# Patient Record
Sex: Female | Born: 1960 | Race: Black or African American | Hispanic: No | Smoking: Current every day smoker
Health system: Southern US, Community
[De-identification: ages and names within clinical notes are randomized; demographics above are authoritative.]

## PROBLEM LIST (undated history)

## (undated) DIAGNOSIS — I509 Heart failure, unspecified: Secondary | ICD-10-CM

## (undated) DIAGNOSIS — I10 Essential (primary) hypertension: Secondary | ICD-10-CM

## (undated) DIAGNOSIS — M1990 Unspecified osteoarthritis, unspecified site: Secondary | ICD-10-CM

## (undated) DIAGNOSIS — E079 Disorder of thyroid, unspecified: Secondary | ICD-10-CM

## (undated) HISTORY — PX: ABDOMINAL HYSTERECTOMY: SHX81

## (undated) HISTORY — DX: Essential (primary) hypertension: I10

## (undated) HISTORY — DX: Heart failure, unspecified: I50.9

---

## 2011-09-29 ENCOUNTER — Ambulatory Visit: Payer: Self-pay

## 2011-10-13 ENCOUNTER — Ambulatory Visit: Payer: Self-pay

## 2011-10-21 ENCOUNTER — Emergency Department: Payer: Self-pay | Admitting: Internal Medicine

## 2011-10-24 ENCOUNTER — Encounter: Payer: Self-pay | Admitting: Emergency Medicine

## 2011-11-21 ENCOUNTER — Encounter: Payer: Self-pay | Admitting: Emergency Medicine

## 2015-07-22 DIAGNOSIS — R7303 Prediabetes: Secondary | ICD-10-CM | POA: Insufficient documentation

## 2015-07-22 DIAGNOSIS — F17200 Nicotine dependence, unspecified, uncomplicated: Secondary | ICD-10-CM | POA: Insufficient documentation

## 2015-09-20 DIAGNOSIS — R030 Elevated blood-pressure reading, without diagnosis of hypertension: Secondary | ICD-10-CM | POA: Insufficient documentation

## 2017-01-16 DIAGNOSIS — J040 Acute laryngitis: Secondary | ICD-10-CM | POA: Insufficient documentation

## 2019-05-27 ENCOUNTER — Emergency Department: Payer: Self-pay

## 2019-05-27 ENCOUNTER — Encounter: Payer: Self-pay | Admitting: Emergency Medicine

## 2019-05-27 ENCOUNTER — Other Ambulatory Visit: Payer: Self-pay

## 2019-05-27 DIAGNOSIS — F17210 Nicotine dependence, cigarettes, uncomplicated: Secondary | ICD-10-CM | POA: Insufficient documentation

## 2019-05-27 DIAGNOSIS — R0789 Other chest pain: Secondary | ICD-10-CM | POA: Insufficient documentation

## 2019-05-27 LAB — CBC
HCT: 41.5 % (ref 36.0–46.0)
Hemoglobin: 13.5 g/dL (ref 12.0–15.0)
MCH: 26.8 pg (ref 26.0–34.0)
MCHC: 32.5 g/dL (ref 30.0–36.0)
MCV: 82.3 fL (ref 80.0–100.0)
Platelets: 186 10*3/uL (ref 150–400)
RBC: 5.04 MIL/uL (ref 3.87–5.11)
RDW: 14.2 % (ref 11.5–15.5)
WBC: 13.8 10*3/uL — ABNORMAL HIGH (ref 4.0–10.5)
nRBC: 0 % (ref 0.0–0.2)

## 2019-05-27 LAB — BASIC METABOLIC PANEL
Anion gap: 12 (ref 5–15)
BUN: 13 mg/dL (ref 6–20)
CO2: 25 mmol/L (ref 22–32)
Calcium: 9.8 mg/dL (ref 8.9–10.3)
Chloride: 95 mmol/L — ABNORMAL LOW (ref 98–111)
Creatinine, Ser: 0.81 mg/dL (ref 0.44–1.00)
GFR calc Af Amer: >60 mL/min (ref >60)
GFR calc non Af Amer: >60 mL/min (ref >60)
Glucose, Bld: 137 mg/dL — ABNORMAL HIGH (ref 70–99)
Potassium: 3.7 mmol/L (ref 3.5–5.1)
Sodium: 132 mmol/L — ABNORMAL LOW (ref 135–145)

## 2019-05-27 LAB — TROPONIN I: Troponin I: <0.03 ng/mL (ref <0.03)

## 2019-05-27 NOTE — ED Triage Notes (Signed)
Pt says she was "backed up " for a few days" because she hasn't taken any medication to help her go; began having pain in her right breast that radiates around to her right back; pt says she took [place] and had a large bowel movement but the pain in her right breast is still present; "I know it's gas"; pt ambulatory with steady gait; talking in complete coherent sentences

## 2019-05-28 ENCOUNTER — Emergency Department
Admission: EM | Admit: 2019-05-28 | Discharge: 2019-05-28 | Disposition: A | Discharge status: Home / Self Care | Payer: Self-pay | Attending: Emergency Medicine | Admitting: Emergency Medicine

## 2019-05-28 DIAGNOSIS — R0789 Other chest pain: Secondary | ICD-10-CM

## 2019-05-28 HISTORY — DX: Disorder of thyroid, unspecified: E07.9

## 2019-05-28 MED ORDER — DICYCLOMINE HCL 20 MG PO TABS: 20.0000 mg | ORAL_TABLET | Freq: Three times a day (TID) | ORAL | 0 refills | Status: DC | PRN

## 2019-05-28 NOTE — Discharge Instructions (Signed)
Please seek medical attention for any high fevers, chest pain, shortness of breath, change in behavior, persistent vomiting, bloody stool or any other new or concerning symptoms.  

## 2019-05-28 NOTE — ED Provider Notes (Signed)
[facility] Emergency Department Provider Note   ____________________________________________   I have reviewed the triage vital signs and the nursing notes.   HISTORY  Chief Complaint Chest pain  History limited by: Not Limited   HPI Katrina Becker is a 58 y.o. female who presents to the emergency department today because of concern for chest pain. She states that it is located around her right breast. The patient states that the pain started a couple of days ago. This was accompanied by constipation which is a frequent problem for the patient. She did take laxatives which resulted in bowel movements. When she has the bowel movements the pain resolves. Additionally she has tried taking advil which helps ease off the pain. The patient states that her appetite has been good and that she has not had any nausea or vomiting. She denies any fevers. Did not notice any blood in her stool.     Records reviewed. Per medical record review patient has a history of thyroid disease.   Past Medical History:  Diagnosis Date  . Thyroid disease     There are no active problems to display for this patient.   Past Surgical History:  Procedure Laterality Date  . ABDOMINAL HYSTERECTOMY    . CESAREAN SECTION      Prior to Admission medications   Not on File    Allergies Patient has no known allergies.  History reviewed. No pertinent family history.  Social History Social History   Tobacco Use  . Smoking status: Current Every Day Smoker    Packs/day: 1.00    Types: Cigarettes  . Smokeless tobacco: Never Used  Substance Use Topics  . Alcohol use: Yes  . Drug use: Never    Review of Systems Constitutional: No fever/chills Eyes: No visual changes. ENT: No sore throat. Cardiovascular: Positive for right chest pain.  Respiratory: Denies shortness of breath. Gastrointestinal: Positive for constipation.  Genitourinary: Negative for  dysuria. Musculoskeletal: Negative for back pain. Skin: Negative for rash. Neurological: Negative for headaches, focal weakness or numbness.  ____________________________________________   PHYSICAL EXAM:  VITAL SIGNS: ED Triage Vitals  Enc Vitals Group     BP 05/27/19 2058 (!) 166/86     Pulse Rate 05/27/19 2058 96     Resp 05/27/19 2058 17     Temp 05/27/19 2058 98.2 F (36.8 C)     Temp Source 05/27/19 2058 Oral     SpO2 05/27/19 2058 95 %     Weight 05/27/19 2100 176 lb (79.8 kg)     Height 05/27/19 2100 5\' 5"  (1.651 m)     Head Circumference --      Peak Flow --      Pain Score 05/27/19 2059 4   Constitutional: Alert and oriented.  Eyes: Conjunctivae are normal.  ENT      Head: Normocephalic and atraumatic.      Nose: No congestion/rhinnorhea.      Mouth/Throat: Mucous membranes are moist.      Neck: No stridor. Hematological/Lymphatic/Immunilogical: No cervical lymphadenopathy. Cardiovascular: Normal rate, regular rhythm.  No murmurs, rubs, or gallops.  Respiratory: Normal respiratory effort without tachypnea nor retractions. Breath sounds are clear and equal bilaterally. No wheezes/rales/rhonchi. Gastrointestinal: Soft and non tender. No rebound. No guarding.  Genitourinary: Deferred Musculoskeletal: Normal range of motion in all extremities. No lower extremity edema. Neurologic:  Normal speech and language. No gross focal neurologic deficits are appreciated.  Skin:  Skin is warm, dry and intact. No rash  noted. Psychiatric: Mood and affect are normal. Speech and behavior are normal. Patient exhibits appropriate insight and judgment.  ____________________________________________    LABS (pertinent positives/negatives)  Trop <0.03 CBC wbc 13.8, hgb 13.5, plt 186 BMP na 132, k 3.7, cl 95, glu 137, cr 0.81  ____________________________________________   EKG  I, Nance Pear, attending physician, personally viewed and interpreted this EKG  EKG Time:  2102 Rate: 89 Rhythm: normal sinus rhythm Axis: normal Intervals: qtc 447 QRS: narrow, LVH ST changes: no st elevation Impression: abnormal ekg  ____________________________________________    RADIOLOGY  CXR Cardiomegaly without acute abnormality  ____________________________________________   PROCEDURES  Procedures  ____________________________________________   INITIAL IMPRESSION / ASSESSMENT AND PLAN / ED COURSE  Pertinent labs & imaging results that were available during my care of the patient were reviewed by me and considered in my medical decision making (see chart for details).   Patient presented because of concern for right chest pain. It is relieved with bowel movements and advil. Patient's exam without any abnormal lung sounds or abdominal tenderness. CXR did not show any pna or ptx. At this point I doubt PE given clinical history. Mild leukocytosis on blood work. Do however doubt gallbladder disease given benign exam and no relation of pain to eating. At this point will give patient prescription for bentyl in case pain is related to abdominal cramping. Discussed return precautions with the patient.   ____________________________________________   FINAL CLINICAL IMPRESSION(S) / ED DIAGNOSES  Final diagnoses:  Atypical chest pain     Note: This dictation was prepared with Dragon dictation. Any transcriptional errors that result from this process are unintentional     Nance Pear, MD 05/28/19 [phone]

## 2019-06-21 DIAGNOSIS — M1611 Unilateral primary osteoarthritis, right hip: Secondary | ICD-10-CM | POA: Insufficient documentation

## 2019-06-21 DIAGNOSIS — M6281 Muscle weakness (generalized): Secondary | ICD-10-CM | POA: Insufficient documentation

## 2019-07-06 ENCOUNTER — Other Ambulatory Visit: Payer: Self-pay | Admitting: *Deleted

## 2019-07-06 DIAGNOSIS — Z20822 Contact with and (suspected) exposure to covid-19: Secondary | ICD-10-CM

## 2019-07-11 LAB — NOVEL CORONAVIRUS, NAA: SARS-CoV-2, NAA: NOT DETECTED

## 2019-11-29 ENCOUNTER — Ambulatory Visit: Payer: Self-pay

## 2019-12-01 ENCOUNTER — Ambulatory Visit: Payer: Self-pay

## 2019-12-28 ENCOUNTER — Ambulatory Visit: Payer: Self-pay

## 2020-06-21 ENCOUNTER — Telehealth: Payer: Self-pay

## 2020-06-21 ENCOUNTER — Other Ambulatory Visit: Payer: Self-pay

## 2020-06-21 NOTE — Telephone Encounter (Signed)
Call to client to reschedule PPD appt for today as agency closed on 06/24/2020. Per recorded message, voicemail box is full. Left message at work number for client to call agency to reschedule appt. Jossie Ng, RN

## 2020-06-25 ENCOUNTER — Other Ambulatory Visit: Payer: Self-pay

## 2020-06-28 ENCOUNTER — Other Ambulatory Visit: Payer: Self-pay

## 2020-07-02 ENCOUNTER — Other Ambulatory Visit: Payer: Self-pay

## 2020-07-02 ENCOUNTER — Ambulatory Visit (LOCAL_COMMUNITY_HEALTH_CENTER): Payer: Self-pay

## 2020-07-02 DIAGNOSIS — Z111 Encounter for screening for respiratory tuberculosis: Secondary | ICD-10-CM

## 2020-07-05 ENCOUNTER — Ambulatory Visit (LOCAL_COMMUNITY_HEALTH_CENTER): Payer: Self-pay

## 2020-07-05 ENCOUNTER — Other Ambulatory Visit: Payer: Self-pay

## 2020-07-05 DIAGNOSIS — Z111 Encounter for screening for respiratory tuberculosis: Secondary | ICD-10-CM

## 2020-07-05 LAB — TB SKIN TEST
Induration: 0 mm
TB Skin Test: NEGATIVE

## 2021-07-29 ENCOUNTER — Inpatient Hospital Stay
Admission: EM | Admit: 2021-07-29 | Discharge: 2021-08-01 | DRG: 291 | Disposition: A | Discharge status: Home / Self Care | Payer: Self-pay | Attending: Internal Medicine | Admitting: Internal Medicine

## 2021-07-29 ENCOUNTER — Encounter: Payer: Self-pay | Admitting: *Deleted

## 2021-07-29 ENCOUNTER — Other Ambulatory Visit: Payer: Self-pay

## 2021-07-29 ENCOUNTER — Emergency Department: Payer: Self-pay

## 2021-07-29 DIAGNOSIS — Z79899 Other long term (current) drug therapy: Secondary | ICD-10-CM

## 2021-07-29 DIAGNOSIS — Z20822 Contact with and (suspected) exposure to covid-19: Secondary | ICD-10-CM | POA: Diagnosis present

## 2021-07-29 DIAGNOSIS — R0602 Shortness of breath: Secondary | ICD-10-CM

## 2021-07-29 DIAGNOSIS — J9601 Acute respiratory failure with hypoxia: Secondary | ICD-10-CM | POA: Diagnosis present

## 2021-07-29 DIAGNOSIS — R000 Tachycardia, unspecified: Secondary | ICD-10-CM

## 2021-07-29 DIAGNOSIS — I5043 Acute on chronic combined systolic (congestive) and diastolic (congestive) heart failure: Secondary | ICD-10-CM | POA: Diagnosis present

## 2021-07-29 DIAGNOSIS — F17210 Nicotine dependence, cigarettes, uncomplicated: Secondary | ICD-10-CM | POA: Diagnosis present

## 2021-07-29 DIAGNOSIS — F17200 Nicotine dependence, unspecified, uncomplicated: Secondary | ICD-10-CM

## 2021-07-29 DIAGNOSIS — I272 Pulmonary hypertension, unspecified: Secondary | ICD-10-CM | POA: Diagnosis present

## 2021-07-29 DIAGNOSIS — I509 Heart failure, unspecified: Secondary | ICD-10-CM

## 2021-07-29 DIAGNOSIS — I10 Essential (primary) hypertension: Secondary | ICD-10-CM

## 2021-07-29 DIAGNOSIS — F1410 Cocaine abuse, uncomplicated: Secondary | ICD-10-CM | POA: Diagnosis present

## 2021-07-29 DIAGNOSIS — I110 Hypertensive heart disease with heart failure: Principal | ICD-10-CM | POA: Diagnosis present

## 2021-07-29 DIAGNOSIS — I161 Hypertensive emergency: Secondary | ICD-10-CM | POA: Diagnosis present

## 2021-07-29 LAB — BASIC METABOLIC PANEL
Anion gap: 10 (ref 5–15)
BUN: 13 mg/dL (ref 6–20)
CO2: 28 mmol/L (ref 22–32)
Calcium: 8.9 mg/dL (ref 8.9–10.3)
Chloride: 102 mmol/L (ref 98–111)
Creatinine, Ser: 0.86 mg/dL (ref 0.44–1.00)
GFR, Estimated: >60 mL/min (ref >60)
Glucose, Bld: 151 mg/dL — ABNORMAL HIGH (ref 70–99)
Potassium: 3.8 mmol/L (ref 3.5–5.1)
Sodium: 140 mmol/L (ref 135–145)

## 2021-07-29 LAB — BRAIN NATRIURETIC PEPTIDE: B Natriuretic Peptide: 446.2 pg/mL — ABNORMAL HIGH (ref 0.0–100.0)

## 2021-07-29 LAB — CBC
HCT: 42.3 % (ref 36.0–46.0)
Hemoglobin: 13.6 g/dL (ref 12.0–15.0)
MCH: 28.1 pg (ref 26.0–34.0)
MCHC: 32.2 g/dL (ref 30.0–36.0)
MCV: 87.4 fL (ref 80.0–100.0)
Platelets: 230 10*3/uL (ref 150–400)
RBC: 4.84 MIL/uL (ref 3.87–5.11)
RDW: 14.6 % (ref 11.5–15.5)
WBC: 10.1 10*3/uL (ref 4.0–10.5)
nRBC: 0 % (ref 0.0–0.2)

## 2021-07-29 LAB — RESP PANEL BY RT-PCR (FLU A&B, COVID) ARPGX2
Influenza A by PCR: NEGATIVE
Influenza B by PCR: NEGATIVE
SARS Coronavirus 2 by RT PCR: NEGATIVE

## 2021-07-29 LAB — D-DIMER, QUANTITATIVE: D-Dimer, Quant: 1.67 ug/mL-FEU — ABNORMAL HIGH (ref 0.00–0.50)

## 2021-07-29 MED ORDER — ONDANSETRON HCL 4 MG/2ML IJ SOLN: 4.0000 mg | Freq: Four times a day (QID) | INTRAMUSCULAR | Status: DC | PRN

## 2021-07-29 MED ORDER — FUROSEMIDE 10 MG/ML IJ SOLN
40.0000 mg | Freq: Two times a day (BID) | INTRAMUSCULAR | Status: DC
  Administered 2021-07-30 – 2021-07-31 (×3): 40 mg via INTRAVENOUS
  Filled 2021-07-29 (×3): qty 4

## 2021-07-29 MED ORDER — MORPHINE SULFATE (PF) 4 MG/ML IV SOLN
6.0000 mg | Freq: Once | INTRAVENOUS | Status: AC
  Administered 2021-07-29: 6 mg via INTRAVENOUS
  Filled 2021-07-29: qty 2

## 2021-07-29 MED ORDER — IOHEXOL 350 MG/ML SOLN
100.0000 mL | Freq: Once | INTRAVENOUS | Status: AC | PRN
  Administered 2021-07-29: 100 mL via INTRAVENOUS

## 2021-07-29 MED ORDER — LOSARTAN POTASSIUM 50 MG PO TABS
50.0000 mg | ORAL_TABLET | Freq: Every day | ORAL | Status: DC
  Administered 2021-07-30 – 2021-08-01 (×4): 50 mg via ORAL
  Filled 2021-07-29 (×4): qty 1

## 2021-07-29 MED ORDER — ONDANSETRON HCL 4 MG PO TABS: 4.0000 mg | ORAL_TABLET | Freq: Four times a day (QID) | ORAL | Status: DC | PRN

## 2021-07-29 MED ORDER — ENOXAPARIN SODIUM 40 MG/0.4ML IJ SOSY
40.0000 mg | PREFILLED_SYRINGE | INTRAMUSCULAR | Status: DC
  Administered 2021-07-30 – 2021-08-01 (×3): 40 mg via SUBCUTANEOUS
  Filled 2021-07-29 (×3): qty 0.4

## 2021-07-29 MED ORDER — SENNOSIDES-DOCUSATE SODIUM 8.6-50 MG PO TABS: 1.0000 | ORAL_TABLET | Freq: Every evening | ORAL | Status: DC | PRN

## 2021-07-29 MED ORDER — ACETAMINOPHEN 325 MG PO TABS
650.0000 mg | ORAL_TABLET | Freq: Four times a day (QID) | ORAL | Status: DC | PRN
  Administered 2021-07-31: 650 mg via ORAL
  Filled 2021-07-29: qty 2

## 2021-07-29 MED ORDER — NICOTINE 14 MG/24HR TD PT24
14.0000 mg | MEDICATED_PATCH | Freq: Every day | TRANSDERMAL | Status: DC
  Administered 2021-07-30 – 2021-08-01 (×3): 14 mg via TRANSDERMAL
  Filled 2021-07-29 (×3): qty 1

## 2021-07-29 MED ORDER — ACETAMINOPHEN 650 MG RE SUPP: 650.0000 mg | Freq: Four times a day (QID) | RECTAL | Status: DC | PRN

## 2021-07-29 MED ORDER — FUROSEMIDE 10 MG/ML IJ SOLN
60.0000 mg | Freq: Once | INTRAMUSCULAR | Status: AC
  Administered 2021-07-29: 60 mg via INTRAVENOUS
  Filled 2021-07-29: qty 8

## 2021-07-29 NOTE — ED Triage Notes (Signed)
Of and on for the past two weeks she has had shortness of breath, worse today. Has been improved with inhaler, but not today. Yellow productive cough. Denies fevers.

## 2021-07-29 NOTE — H&P (Signed)
History and Physical    Katrina Becker GYJ:[id number] DOB: 1961/04/28 DOA: 07/29/2021  [facility]   Patient coming from: home  I have personally briefly reviewed patient's old medical records in [facility]  Chief Complaint: shortness of breath  HPI: Katrina Becker is a 60 y.o. female with medical history significant for   Nicotine dependence and prior documentation of elevated BP as far back as 2017 but without a formal diagnosis of hypertension,who presents with a 2-week history of shortness of breath that has been progressively worsening becoming acutely worse on the night of arrival.  She was initially using a friend's albuterol inhaler with some improvement but over the past couple days it has not helped.  She has an associated cough productive of whitish-yellow phlegm but denies fever or chills.  Denies chest pain.  Denies lower extremity pain or swelling.  Denies nausea, vomiting, abdominal pain or diarrhea.   ED course: On arrival to the ED she was reportedly in  obvious respiratory distress, sitting up and tripoding.   Vitals: Afebrile with BP 153/103, pulse 110, respirations 20 with O2 sat 91 to 96% on room air Blood work: CBC WNL, BMP with slightly elevated blood glucose of 151 but otherwise normal.  BNP 446 and D-dimer 1.67.  No troponin done  EKG, personally viewed and interpreted: Sinus tachycardia at 100 with nonspecific ST-T wave changes  Imaging: Chest x-ray with bilateral perihilar and lower lobe opacities favor edema/CHF CT angio PE negative for acute PE, small bilateral effusions diffuse interstitial pulmonary densities suspected to represent pulmonary edema Dilated main pulmonary vessels suggesting arterial hypertension  Patient was treated with IV Lasix and morphine with improvement in symptoms.  Hospitalist consulted for admission  Review of Systems: As per HPI otherwise all other systems on review of systems negative.    Past Medical History:   Diagnosis Date   Thyroid disease     Past Surgical History:  Procedure Laterality Date   ABDOMINAL HYSTERECTOMY     CESAREAN SECTION       reports that she has been smoking cigarettes. She has been smoking an average of 1 pack per day. She has never used smokeless tobacco. She reports current alcohol use of about 9.0 standard drinks of alcohol per week. She reports current drug use. Drug: Cocaine.  No Known Allergies  History reviewed. No pertinent family history.    Prior to Admission medications   Medication Sig Start Date End Date Taking? Authorizing Provider  dicyclomine (BENTYL) 20 MG tablet Take 1 tablet (20 mg total) by mouth 3 (three) times daily as needed. 05/28/19   Phineas Semen, MD    Physical Exam: Vitals:   07/29/21 2100 07/29/21 2115 07/29/21 2145 07/29/21 2220  BP: (!) 146/108 (!) 158/126 (!) 153/103 (!) 113/95  Pulse: (!) 110 (!) 114 (!) 110 98  Resp: (!) 21 (!) 29 17 20   Temp:      TempSrc:      SpO2: 95% 94% 91% 96%     Vitals:   07/29/21 2100 07/29/21 2115 07/29/21 2145 07/29/21 2220  BP: (!) 146/108 (!) 158/126 (!) 153/103 (!) 113/95  Pulse: (!) 110 (!) 114 (!) 110 98  Resp: (!) 21 (!) 29 17 20   Temp:      TempSrc:      SpO2: 95% 94% 91% 96%      Constitutional: Alert and oriented x 3 .  Conversational dyspnea HEENT:      Head: Normocephalic and atraumatic.  Eyes: PERLA, EOMI, Conjunctivae are normal. Sclera is non-icteric.       Mouth/Throat: Mucous membranes are moist.       Neck: Supple with no signs of meningismus. Cardiovascular: Tachycardia. No murmurs, gallops, or rubs. 2+ symmetrical distal pulses are present . No JVD. No LE edema Respiratory: Respiratory effort increased.Lungs sounds diminished bilaterally. No wheezes, crackles, or rhonchi.  Gastrointestinal: Soft, non tender, and non distended with positive bowel sounds.  Genitourinary: No CVA tenderness. Musculoskeletal: Nontender with normal range of motion in all  extremities. No cyanosis, or erythema of extremities. Neurologic:  Face is symmetric. Moving all extremities. No gross focal neurologic deficits . Skin: Skin is warm, dry.  No rash or ulcers Psychiatric: Mood and affect are normal    Labs on Admission: I have personally reviewed following labs and imaging studies  CBC: Recent Labs  Lab 07/29/21 2023  WBC 10.1  HGB 13.6  HCT 42.3  MCV 87.4  PLT 230   Basic Metabolic Panel: Recent Labs  Lab 07/29/21 2023  NA 140  K 3.8  CL 102  CO2 28  GLUCOSE 151*  BUN 13  CREATININE 0.86  CALCIUM 8.9   GFR: CrCl cannot be calculated (Unknown ideal weight.). Liver Function Tests: No results for input(s): AST, ALT, ALKPHOS, BILITOT, PROT, ALBUMIN in the last 168 hours. No results for input(s): LIPASE, AMYLASE in the last 168 hours. No results for input(s): AMMONIA in the last 168 hours. Coagulation Profile: No results for input(s): INR, PROTIME in the last 168 hours. Cardiac Enzymes: No results for input(s): CKTOTAL, CKMB, CKMBINDEX, TROPONINI in the last 168 hours. BNP (last 3 results) No results for input(s): PROBNP in the last 8760 hours. HbA1C: No results for input(s): HGBA1C in the last 72 hours. CBG: No results for input(s): GLUCAP in the last 168 hours. Lipid Profile: No results for input(s): CHOL, HDL, LDLCALC, TRIG, CHOLHDL, LDLDIRECT in the last 72 hours. Thyroid Function Tests: No results for input(s): TSH, T4TOTAL, FREET4, T3FREE, THYROIDAB in the last 72 hours. Anemia Panel: No results for input(s): VITAMINB12, FOLATE, FERRITIN, TIBC, IRON, RETICCTPCT in the last 72 hours. Urine analysis: No results found for: COLORURINE, APPEARANCEUR, LABSPEC, PHURINE, GLUCOSEU, HGBUR, BILIRUBINUR, KETONESUR, PROTEINUR, UROBILINOGEN, NITRITE, LEUKOCYTESUR  Radiological Exams on Admission: DG Chest 2 View  Result Date: 07/29/2021 CLINICAL DATA:  Shortness of breath EXAM: CHEST - 2 VIEW COMPARISON:  05/27/2019 FINDINGS:  Cardiomegaly. Bilateral perihilar and lower lobe airspace disease, likely edema. No effusions. No acute bony abnormality. IMPRESSION: Bilateral perihilar and lower lobe opacities, favor edema/CHF. Electronically Signed   By: Charlett Nose M.D.   On: 07/29/2021 20:29   CT Angio Chest PE W and/or Wo Contrast  Result Date: 07/29/2021 CLINICAL DATA:  Shortness of breath chest pain EXAM: CT ANGIOGRAPHY CHEST WITH CONTRAST TECHNIQUE: Multidetector CT imaging of the chest was performed using the standard protocol during bolus administration of intravenous contrast. Multiplanar CT image reconstructions and MIPs were obtained to evaluate the vascular anatomy. CONTRAST:  OMNIPAQUE IOHEXOL 350 MG/ML SOLN COMPARISON:  Chest x-ray 07/29/2021 FINDINGS: Cardiovascular: Satisfactory opacification of the pulmonary arteries to the segmental level. No evidence of pulmonary embolism. Mild aortic atherosclerosis. No aneurysmal dilatation. Cardiomegaly. No significant pericardial effusion. Dilated pulmonary trunk and main pulmonary arteries suggesting arterial hypertension. Mediastinum/Nodes: Midline trachea. No thyroid mass. Mild mediastinal adenopathy. Right paratracheal lymph node measures 12 mm. AP window lymph nodes measure up to 14 mm. Esophagus within normal limits Lungs/Pleura: Small bilateral pleural effusions. Diffuse bilateral septal thickening  and mild hazy lung density. Upper Abdomen: No acute abnormality. Musculoskeletal: No chest wall abnormality. No acute or significant osseous findings. Review of the MIP images confirms the above findings. IMPRESSION: 1. Negative for acute pulmonary embolus. 2. Cardiomegaly with small bilateral effusions. Diffuse interstitial and hazy pulmonary densities suspected to represent pulmonary edema 3. Mild nonspecific mediastinal adenopathy 4. Dilated main pulmonary vessels suggesting arterial hypertension Aortic Atherosclerosis (ICD10-I70.0). Electronically Signed   By: Jasmine Pang  M.D.   On: 07/29/2021 22:21     Assessment/Plan 60 year old female with history of nicotine dependence and prior documentation of elevated BP (SBP 150s )as far back as 2017 but without a formal diagnosis of hypertension, presenting with a 2-week history of shortness of breath that has been progressively worsening becoming acutely worse on the night of arrival.      Acute CHF/acute pulmonary edema   Hypertensive emergency - Patient presents with respiratory distress, tachypneic, BP of 173/115, BNP 440s with CTA negative for PE but showing diffuse pulmonary edema.  BNP 440 - Continue IV Lasix twice daily - Hydralazine as needed for blood pressure - Start losartan 50 mg daily - Supplemental oxygen as needed - Daily weights with intake and output monitoring - Echocardiogram in the a.m.    Pulmonary hypertension on CT [facility]) - Follow-up echocardiogram to quantify degree of pulmonary hypertension    Nicotine dependence - Nicotine patch - Tobacco cessation counseling .  Substance abuse - Patient admits to using cocaine intermittently for years with increased use prior to onset of symptoms - Counseling patient on abstinence. daughter at bedside who reinforced message     DVT prophylaxis: Lovenox  Code Status: full code  Family Communication: Daughter at bedside Disposition Plan: Back to previous home environment Consults called: none  Status:At the time of admission, it appears that the appropriate admission status for this patient is INPATIENT. This is judged to be reasonable and necessary in order to provide the required intensity of service to ensure the patient's safety given the presenting symptoms, physical exam findings, and initial radiographic and laboratory data in the context of their  Comorbid conditions.   Patient requires inpatient status due to high intensity of service, high risk for further deterioration and high frequency of surveillance required.   I certify that at the  point of admission it is my clinical judgment that the patient will require inpatient hospital care spanning beyond 2 midnights     Andris Baumann MD [facility]     07/29/2021, 10:49 PM

## 2021-07-29 NOTE — ED Provider Notes (Signed)
[facility] Emergency Department Provider Note ____________________________________________   Event Date/Time   First MD Initiated Contact with Patient 07/29/21 2021     (approximate)  I have reviewed the triage vital signs and the nursing notes.  HISTORY  Chief Complaint No chief complaint on file.   HPI Katrina Becker is a 60 y.o. femalewho presents to the ED for evaluation of shortness of breath.   Chart review indicates cigarette smoker without much relevant history otherwise. Patient reports a 10-pack-year smoking history.  Patient presents to the ED for evaluation of worsening shortness of breath.  She reports symptoms intermittently over the past couple weeks, but today her symptoms drastically worsened.  She reports using a friend's albuterol inhaler over the past week intermittently with some improvement, but no improvement today when she tried it.  She reports cough productive of thin frothy sputum, sometimes yellow in color.  She was report "sometimes" having chest pain with her symptoms.  Denies fevers, syncopal episodes, abdominal pain, emesis  She does report taking milk of magnesia and MiraLAX at home today because she thought it might help with her breathing.  Therefore reports having watery diarrhea today   Past Medical History:  Diagnosis Date   Thyroid disease     There are no problems to display for this patient.   Past Surgical History:  Procedure Laterality Date   ABDOMINAL HYSTERECTOMY     CESAREAN SECTION      Prior to Admission medications   Medication Sig Start Date End Date Taking? Authorizing Provider  dicyclomine (BENTYL) 20 MG tablet Take 1 tablet (20 mg total) by mouth 3 (three) times daily as needed. 05/28/19   Phineas Semen, MD    Allergies Patient has no known allergies.  No family history on file.  Social History Social History   Tobacco Use   Smoking status: Every Day    Packs/day: 1.00     Types: Cigarettes   Smokeless tobacco: Never  Substance Use Topics   Alcohol use: Yes   Drug use: Never    Review of Systems  Constitutional: No fever/chills Eyes: No visual changes. ENT: No sore throat. Cardiovascular: Positive for chest pain. Respiratory: Positive for cough and shortness of breath. Gastrointestinal: No abdominal pain.  No nausea, no vomiting.    No constipation. Positive for diarrhea Genitourinary: Negative for dysuria. Musculoskeletal: Negative for back pain. Skin: Negative for rash. Neurological: Negative for headaches, focal weakness or numbness.  ____________________________________________   PHYSICAL EXAM:  VITAL SIGNS: Vitals:   07/29/21 2145 07/29/21 2220  BP: (!) 153/103 (!) 113/95  Pulse: (!) 110 98  Resp: 17 20  Temp:    SpO2: 91% 96%     Constitutional: Alert and oriented.  Restless in bed.  Prefers to sit upright and tripod Eyes: Conjunctivae are normal. PERRL. EOMI. Head: Atraumatic. Nose: No congestion/rhinnorhea. Mouth/Throat: Mucous membranes are moist.  Oropharynx non-erythematous. Neck: No stridor. No cervical spine tenderness to palpation. Cardiovascular: Tachycardic rate, regular rhythm. Grossly normal heart sounds.  Good peripheral circulation. Respiratory: Tachypneic to nearly 30.  No wheezes noted and bibasilar crackles are present Gastrointestinal: Soft , nondistended, nontender to palpation. No CVA tenderness. Musculoskeletal: No lower extremity tenderness.  No joint effusions. No signs of acute trauma. Trace pitting edema to bilateral lower extremities. Neurologic:  Normal speech and language. No gross focal neurologic deficits are appreciated. No gait instability noted. Skin:  Skin is warm, dry and intact. No rash noted. Psychiatric: Mood and affect are  normal. Speech and behavior are normal.  ____________________________________________   LABS (all labs ordered are listed, but only abnormal results are  displayed)  Labs Reviewed  BASIC METABOLIC PANEL - Abnormal; Notable for the following components:      Result Value   Glucose, Bld 151 (*)    All other components within normal limits  D-DIMER, QUANTITATIVE - Abnormal; Notable for the following components:   D-Dimer, Quant 1.67 (*)    All other components within normal limits  BRAIN NATRIURETIC PEPTIDE - Abnormal; Notable for the following components:   B Natriuretic Peptide 446.2 (*)    All other components within normal limits  RESP PANEL BY RT-PCR (FLU A&B, COVID) ARPGX2  CBC   ____________________________________________  12 Lead EKG  Sinus tachycardia with a rate of 100 bpm.  Rightward axis.  Normal intervals.  No STEMI. ____________________________________________  RADIOLOGY  ED MD interpretation: 2 view CXR reviewed by me with pulmonary vascular congestion  Official radiology report(s): DG Chest 2 View  Result Date: 07/29/2021 CLINICAL DATA:  Shortness of breath EXAM: CHEST - 2 VIEW COMPARISON:  05/27/2019 FINDINGS: Cardiomegaly. Bilateral perihilar and lower lobe airspace disease, likely edema. No effusions. No acute bony abnormality. IMPRESSION: Bilateral perihilar and lower lobe opacities, favor edema/CHF. Electronically Signed   By: Charlett Nose M.D.   On: 07/29/2021 20:29   CT Angio Chest PE W and/or Wo Contrast  Result Date: 07/29/2021 CLINICAL DATA:  Shortness of breath chest pain EXAM: CT ANGIOGRAPHY CHEST WITH CONTRAST TECHNIQUE: Multidetector CT imaging of the chest was performed using the standard protocol during bolus administration of intravenous contrast. Multiplanar CT image reconstructions and MIPs were obtained to evaluate the vascular anatomy. CONTRAST:  OMNIPAQUE IOHEXOL 350 MG/ML SOLN COMPARISON:  Chest x-ray 07/29/2021 FINDINGS: Cardiovascular: Satisfactory opacification of the pulmonary arteries to the segmental level. No evidence of pulmonary embolism. Mild aortic atherosclerosis. No aneurysmal  dilatation. Cardiomegaly. No significant pericardial effusion. Dilated pulmonary trunk and main pulmonary arteries suggesting arterial hypertension. Mediastinum/Nodes: Midline trachea. No thyroid mass. Mild mediastinal adenopathy. Right paratracheal lymph node measures 12 mm. AP window lymph nodes measure up to 14 mm. Esophagus within normal limits Lungs/Pleura: Small bilateral pleural effusions. Diffuse bilateral septal thickening and mild hazy lung density. Upper Abdomen: No acute abnormality. Musculoskeletal: No chest wall abnormality. No acute or significant osseous findings. Review of the MIP images confirms the above findings. IMPRESSION: 1. Negative for acute pulmonary embolus. 2. Cardiomegaly with small bilateral effusions. Diffuse interstitial and hazy pulmonary densities suspected to represent pulmonary edema 3. Mild nonspecific mediastinal adenopathy 4. Dilated main pulmonary vessels suggesting arterial hypertension Aortic Atherosclerosis (ICD10-I70.0). Electronically Signed   By: Jasmine Pang M.D.   On: 07/29/2021 22:21    ____________________________________________   PROCEDURES and INTERVENTIONS  Procedure(s) performed (including Critical Care):  .1-3 Lead EKG Interpretation  Date/Time: 07/29/2021 10:12 PM Performed by: Delton Prairie, MD Authorized by: Delton Prairie, MD     Interpretation: abnormal     ECG rate:  110   ECG rate assessment: tachycardic     Rhythm: sinus tachycardia     Ectopy: none     Conduction: normal    Medications  furosemide (LASIX) injection 60 mg (60 mg Intravenous Given 07/29/21 2114)  iohexol (OMNIPAQUE) 350 MG/ML injection 100 mL (100 mLs Intravenous Contrast Given 07/29/21 2205)  morphine 4 MG/ML injection 6 mg (6 mg Intravenous Given 07/29/21 2144)    ____________________________________________   MDM / ED COURSE   60 year old woman presents to the ED  with worsening shortness of breath and tachycardia, with evidence of new onset CHF requiring medical  admission.  Tachycardic, slightly hypertensive and with sats in the low 90s on room air.  Exam demonstrates very restless and uncomfortable-appearing patient who prefers to sit upright in tripod, with her clinical picture improving after small dose of morphine.  Placed on nasal cannula for comfort.  CXR congested without PTX or discrete lobar infiltration.  Blood work with elevated BNP and D-dimer.  CTA chest rules out acute PE, and further shows evidence of CHF.  Initiated diuresis here in the ED with 60 of IV Lasix.  We will admit to medicine for further work-up and management.  Clinical Course as of 07/29/21 2227  Tue Jul 29, 2021  2122 reassessed [DS]  2139 Reassessed.  Patient had refused to go to CT to get a CTA chest due to her dyspnea.  She reports fear of laying flat due to her respiratory symptoms.  We discussed my concern for the possibility of PE and the need for CTA chest.  We discussed medication to help with her air hunger sensation to facilitate CT.  She is agreeable. [DS]  2154 Patient looks much more comfortable after morphine.  She is agreeable to trying a CT [DS]    Clinical Course User Index [DS] Delton Prairie, MD    ____________________________________________   FINAL CLINICAL IMPRESSION(S) / ED DIAGNOSES  Final diagnoses:  New onset of congestive heart failure (HCC)  Shortness of breath  Sinus tachycardia     ED Discharge Orders     None        Zaara Sprowl   Note:  This document was prepared using Dragon voice recognition software and may include unintentional dictation errors.    Delton Prairie, MD 07/29/21 2227

## 2021-07-30 ENCOUNTER — Inpatient Hospital Stay
Admit: 2021-07-30 | Discharge: 2021-07-30 | Disposition: A | Discharge status: Home / Self Care | Payer: Self-pay | Attending: Internal Medicine | Admitting: Internal Medicine

## 2021-07-30 ENCOUNTER — Encounter: Payer: Self-pay | Admitting: Internal Medicine

## 2021-07-30 LAB — BASIC METABOLIC PANEL
Anion gap: 6 (ref 5–15)
BUN: 10 mg/dL (ref 6–20)
CO2: 26 mmol/L (ref 22–32)
Calcium: 7.1 mg/dL — ABNORMAL LOW (ref 8.9–10.3)
Chloride: 107 mmol/L (ref 98–111)
Creatinine, Ser: 0.57 mg/dL (ref 0.44–1.00)
GFR, Estimated: >60 mL/min (ref >60)
Glucose, Bld: 150 mg/dL — ABNORMAL HIGH (ref 70–99)
Potassium: 3.2 mmol/L — ABNORMAL LOW (ref 3.5–5.1)
Sodium: 139 mmol/L (ref 135–145)

## 2021-07-30 LAB — HIV ANTIBODY (ROUTINE TESTING W REFLEX): HIV Screen 4th Generation wRfx: NONREACTIVE

## 2021-07-30 MED ORDER — DIPHENHYDRAMINE HCL 25 MG PO CAPS
25.0000 mg | ORAL_CAPSULE | Freq: Every evening | ORAL | Status: DC | PRN
  Administered 2021-07-30: 25 mg via ORAL
  Filled 2021-07-30: qty 1

## 2021-07-30 NOTE — Progress Notes (Signed)
PROGRESS NOTE    Katrina Becker  NIO:[id number] DOB: 25-Dec-1960 DOA: 07/29/2021 [facility]    Brief Narrative:  60 y.o. female with medical history significant for   Nicotine dependence and prior documentation of elevated BP as far back as 2017 but without a formal diagnosis of hypertension,who presents with a 2-week history of shortness of breath that has been progressively worsening becoming acutely worse on the night of arrival.  She was initially using a friend's albuterol inhaler with some improvement but over the past couple days it has not helped.  She has an associated cough productive of whitish-yellow phlegm but denies fever or chills.  Denies chest pain.  Denies lower extremity pain or swelling.  Denies nausea, vomiting, abdominal pain or diarrhea.  Admitted under working diagnosis of decompensated congestive heart failure.  Patient has no history of above diagnosis.  Endorses marked improvement in respiratory symptoms since admission and initiation of IV diuresis.   Assessment & Plan:   Principal Problem:   Acute CHF (congestive heart failure) (HCC) Active Problems:   Hypertensive emergency   Nicotine dependence   Essential hypertension   Pulmonary hypertension on CT [facility])  Pulmonary edema associate with shortness of breath Hypertensive emergency Suspected new diagnosis congestive heart failure Acute hypoxic respiratory failure secondary to above - Patient presents with respiratory distress, tachypneic, BP of 173/115, BNP 440s with CTA negative for PE but showing diffuse pulmonary edema.  Strong suspicion for decompensated congestive heart failure Plan: Continue IV diuresis Continues losartan 50 mg daily As needed hydralazine Follow-up TTE Wean oxygen as tolerated Possible discharge in 24 hours of echocardiogram reassuring Continue daily weights and strict I's and O's   Suspected pulmonary hypertension - Follow-up echocardiogram to quantify degree of  pulmonary hypertension     Nicotine dependence - Nicotine patch - Tobacco cessation counseling .  Substance abuse - Patient admits to using cocaine intermittently for years with increased use prior to onset of symptoms - Counseling patient on abstinence. daughter at bedside who reinforced message     DVT prophylaxis: SQ Lovenox Code Status: Full Family Communication: None today Disposition Plan: Status is: Inpatient  Remains inpatient appropriate because:Inpatient level of care appropriate due to severity of illness  Dispo: The patient is from: Home              Anticipated d/c is to: Home              Patient currently is not medically stable to d/c.   Difficult to place patient No       Level of care: Progressive Cardiac  Consultants:  None  Procedures:  None  Antimicrobials:  None   Subjective: Seen and examined.  No pain complaints.  Patient endorses significant improvement since initiation of IV diuretic  Objective: Vitals:   07/30/21 0830 07/30/21 0845 07/30/21 0900 07/30/21 1000  BP: (!) 148/101 125/89 135/81 121/67  Pulse: 88 84 (!) 56 81  Resp: (!) 23 (!) 22 17 (!) 23  Temp:      TempSrc:      SpO2: 100% 96% 95% (!) 84%  Weight:      Height:        Intake/Output Summary (Last 24 hours) at 07/30/2021 1509 Last data filed at 07/30/2021 0851 Gross per 24 hour  Intake --  Output 1475 ml  Net -1475 ml   Filed Weights   07/29/21 2336  Weight: 79.4 kg    Examination:  General exam: Appears calm and comfortable  Respiratory system: Scattered crackles bilaterally.  Normal work of breathing.  2 L Cardiovascular system: S1-S2, regular rate and rhythm, no murmurs, no pedal edema Gastrointestinal system: Nontender, nondistended, normal bowel sounds Central nervous system: Alert and oriented. No focal neurological deficits. Extremities: Symmetric 5 x 5 power. Skin: No rashes, lesions or ulcers Psychiatry: Judgement and insight appear normal. Mood  & affect appropriate.     Data Reviewed: I have personally reviewed following labs and imaging studies  CBC: Recent Labs  Lab 07/29/21 2023  WBC 10.1  HGB 13.6  HCT 42.3  MCV 87.4  PLT 230   Basic Metabolic Panel: Recent Labs  Lab 07/29/21 2023 07/30/21 0456  NA 140 139  K 3.8 3.2*  CL 102 107  CO2 28 26  GLUCOSE 151* 150*  BUN 13 10  CREATININE 0.86 0.57  CALCIUM 8.9 7.1*   GFR: Estimated Creatinine Clearance: 83.8 mL/min (by C-G formula based on SCr of 0.57 mg/dL). Liver Function Tests: No results for input(s): AST, ALT, ALKPHOS, BILITOT, PROT, ALBUMIN in the last 168 hours. No results for input(s): LIPASE, AMYLASE in the last 168 hours. No results for input(s): AMMONIA in the last 168 hours. Coagulation Profile: No results for input(s): INR, PROTIME in the last 168 hours. Cardiac Enzymes: No results for input(s): CKTOTAL, CKMB, CKMBINDEX, TROPONINI in the last 168 hours. BNP (last 3 results) No results for input(s): PROBNP in the last 8760 hours. HbA1C: No results for input(s): HGBA1C in the last 72 hours. CBG: No results for input(s): GLUCAP in the last 168 hours. Lipid Profile: No results for input(s): CHOL, HDL, LDLCALC, TRIG, CHOLHDL, LDLDIRECT in the last 72 hours. Thyroid Function Tests: No results for input(s): TSH, T4TOTAL, FREET4, T3FREE, THYROIDAB in the last 72 hours. Anemia Panel: No results for input(s): VITAMINB12, FOLATE, FERRITIN, TIBC, IRON, RETICCTPCT in the last 72 hours. Sepsis Labs: No results for input(s): PROCALCITON, LATICACIDVEN in the last 168 hours.  Recent Results (from the past 240 hour(s))  Resp Panel by RT-PCR (Flu A&B, Covid) Nasopharyngeal Swab     Status: None   Collection Time: 07/29/21  8:37 PM   Specimen: Nasopharyngeal Swab; Nasopharyngeal(NP) swabs in vial transport medium  Result Value Ref Range Status   SARS Coronavirus 2 by RT PCR NEGATIVE NEGATIVE Final    Comment: (NOTE) SARS-CoV-2 target nucleic acids are  NOT DETECTED.  The SARS-CoV-2 RNA is generally detectable in upper respiratory specimens during the acute phase of infection. The lowest concentration of SARS-CoV-2 viral copies this assay can detect is 138 copies/mL. A negative result does not preclude SARS-Cov-2 infection and should not be used as the sole basis for treatment or other patient management decisions. A negative result may occur with  improper specimen collection/handling, submission of specimen other than nasopharyngeal swab, presence of viral mutation(s) within the areas targeted by this assay, and inadequate number of viral copies(<138 copies/mL). A negative result must be combined with clinical observations, patient history, and epidemiological information. The expected result is Negative.  Fact Sheet for Patients:  BloggerCourse.com  Fact Sheet for Healthcare Providers:  SeriousBroker.it  This test is no t yet approved or cleared by the [place] FDA and  has been authorized for detection and/or diagnosis of SARS-CoV-2 by FDA under an Emergency Use Authorization (EUA). This EUA will remain  in effect (meaning this test can be used) for the duration of the COVID-19 declaration under Section 564(b)(1) of the Act, 21 U.S.C.section 360bbb-3(b)(1), unless the authorization is terminated  or revoked  sooner.       Influenza A by PCR NEGATIVE NEGATIVE Final   Influenza B by PCR NEGATIVE NEGATIVE Final    Comment: (NOTE) The Xpert Xpress SARS-CoV-2/FLU/RSV plus assay is intended as an aid in the diagnosis of influenza from Nasopharyngeal swab specimens and should not be used as a sole basis for treatment. Nasal washings and aspirates are unacceptable for Xpert Xpress SARS-CoV-2/FLU/RSV testing.  Fact Sheet for Patients: BloggerCourse.com  Fact Sheet for Healthcare Providers: SeriousBroker.it  This test is not  yet approved or cleared by the [place] FDA and has been authorized for detection and/or diagnosis of SARS-CoV-2 by FDA under an Emergency Use Authorization (EUA). This EUA will remain in effect (meaning this test can be used) for the duration of the COVID-19 declaration under Section 564(b)(1) of the Act, 21 U.S.C. section 360bbb-3(b)(1), unless the authorization is terminated or revoked.  Performed at [facility], [address] [place], [place] [address]          Radiology Studies: DG Chest 2 View  Result Date: 07/29/2021 CLINICAL DATA:  Shortness of breath EXAM: CHEST - 2 VIEW COMPARISON:  05/27/2019 FINDINGS: Cardiomegaly. Bilateral perihilar and lower lobe airspace disease, likely edema. No effusions. No acute bony abnormality. IMPRESSION: Bilateral perihilar and lower lobe opacities, favor edema/CHF. Electronically Signed   By: Charlett Nose M.D.   On: 07/29/2021 20:29   CT Angio Chest PE W and/or Wo Contrast  Result Date: 07/29/2021 CLINICAL DATA:  Shortness of breath chest pain EXAM: CT ANGIOGRAPHY CHEST WITH CONTRAST TECHNIQUE: Multidetector CT imaging of the chest was performed using the standard protocol during bolus administration of intravenous contrast. Multiplanar CT image reconstructions and MIPs were obtained to evaluate the vascular anatomy. CONTRAST:  OMNIPAQUE IOHEXOL 350 MG/ML SOLN COMPARISON:  Chest x-ray 07/29/2021 FINDINGS: Cardiovascular: Satisfactory opacification of the pulmonary arteries to the segmental level. No evidence of pulmonary embolism. Mild aortic atherosclerosis. No aneurysmal dilatation. Cardiomegaly. No significant pericardial effusion. Dilated pulmonary trunk and main pulmonary arteries suggesting arterial hypertension. Mediastinum/Nodes: Midline trachea. No thyroid mass. Mild mediastinal adenopathy. Right paratracheal lymph node measures 12 mm. AP window lymph nodes measure up to 14 mm. Esophagus within normal limits  Lungs/Pleura: Small bilateral pleural effusions. Diffuse bilateral septal thickening and mild hazy lung density. Upper Abdomen: No acute abnormality. Musculoskeletal: No chest wall abnormality. No acute or significant osseous findings. Review of the MIP images confirms the above findings. IMPRESSION: 1. Negative for acute pulmonary embolus. 2. Cardiomegaly with small bilateral effusions. Diffuse interstitial and hazy pulmonary densities suspected to represent pulmonary edema 3. Mild nonspecific mediastinal adenopathy 4. Dilated main pulmonary vessels suggesting arterial hypertension Aortic Atherosclerosis (ICD10-I70.0). Electronically Signed   By: Jasmine Pang M.D.   On: 07/29/2021 22:21        Scheduled Meds:  enoxaparin (LOVENOX) injection  40 mg Subcutaneous Q24H   furosemide  40 mg Intravenous BID   losartan  50 mg Oral Daily   nicotine  14 mg Transdermal Daily   Continuous Infusions:   LOS: 1 day    Time spent: 25 minutes    Tresa Moore, MD [facility] Pager [phone]  If 7PM-7AM, please contact night-coverage 07/30/2021, 3:09 PM

## 2021-07-30 NOTE — ED Notes (Signed)
Per attending provider, try to wean pt off oxygen. Weaned from 2L to 1L, SPO2 now 99%. Turned off oxygen, SPO2 100%.  Pt denies CP, SOB while semi reclining.

## 2021-07-30 NOTE — ED Notes (Signed)
Dr Sreenath at bedside 

## 2021-07-30 NOTE — Plan of Care (Signed)

## 2021-07-30 NOTE — ED Notes (Signed)
HOB adjusted. Pt has no further needs at this time. Call light within reach.

## 2021-07-30 NOTE — ED Notes (Signed)
Patient with shortness of breath and cough, O2 saturation is 97% room air.  Lasix given per order.

## 2021-07-30 NOTE — Progress Notes (Signed)
  CSW contacted Heart Failure RN, w/ heart failure consult request. 

## 2021-07-31 LAB — BASIC METABOLIC PANEL
Anion gap: 8 (ref 5–15)
BUN: 13 mg/dL (ref 6–20)
CO2: 30 mmol/L (ref 22–32)
Calcium: 8.9 mg/dL (ref 8.9–10.3)
Chloride: 101 mmol/L (ref 98–111)
Creatinine, Ser: 0.81 mg/dL (ref 0.44–1.00)
GFR, Estimated: >60 mL/min (ref >60)
Glucose, Bld: 106 mg/dL — ABNORMAL HIGH (ref 70–99)
Potassium: 3.5 mmol/L (ref 3.5–5.1)
Sodium: 139 mmol/L (ref 135–145)

## 2021-07-31 LAB — CBC WITH DIFFERENTIAL/PLATELET
Abs Immature Granulocytes: 0.05 10*3/uL (ref 0.00–0.07)
Basophils Absolute: 0 10*3/uL (ref 0.0–0.1)
Basophils Relative: 0 %
Eosinophils Absolute: 0.1 10*3/uL (ref 0.0–0.5)
Eosinophils Relative: 1 %
HCT: 41.3 % (ref 36.0–46.0)
Hemoglobin: 13.3 g/dL (ref 12.0–15.0)
Immature Granulocytes: 1 %
Lymphocytes Relative: 22 %
Lymphs Abs: 2.1 10*3/uL (ref 0.7–4.0)
MCH: 28 pg (ref 26.0–34.0)
MCHC: 32.2 g/dL (ref 30.0–36.0)
MCV: 86.9 fL (ref 80.0–100.0)
Monocytes Absolute: 0.8 10*3/uL (ref 0.1–1.0)
Monocytes Relative: 9 %
Neutro Abs: 6.7 10*3/uL (ref 1.7–7.7)
Neutrophils Relative %: 67 %
Platelets: 240 10*3/uL (ref 150–400)
RBC: 4.75 MIL/uL (ref 3.87–5.11)
RDW: 14.3 % (ref 11.5–15.5)
WBC: 9.8 10*3/uL (ref 4.0–10.5)
nRBC: 0 % (ref 0.0–0.2)

## 2021-07-31 MED ORDER — GUAIFENESIN-DM 100-10 MG/5ML PO SYRP: 5.0000 mL | ORAL_SOLUTION | ORAL | Status: DC | PRN

## 2021-07-31 MED ORDER — FUROSEMIDE 10 MG/ML IJ SOLN
40.0000 mg | Freq: Every day | INTRAMUSCULAR | Status: DC
  Administered 2021-08-01: 40 mg via INTRAVENOUS
  Filled 2021-07-31: qty 4

## 2021-07-31 MED ORDER — BENZONATATE 100 MG PO CAPS
200.0000 mg | ORAL_CAPSULE | Freq: Three times a day (TID) | ORAL | Status: DC
  Administered 2021-07-31 – 2021-08-01 (×4): 200 mg via ORAL
  Filled 2021-07-31 (×4): qty 2

## 2021-07-31 MED ORDER — LORAZEPAM 2 MG/ML IJ SOLN
0.5000 mg | INTRAMUSCULAR | Status: DC | PRN
  Administered 2021-07-31: 0.5 mg via INTRAVENOUS
  Filled 2021-07-31 (×2): qty 1

## 2021-07-31 NOTE — Consult Note (Signed)
   Heart Failure Nurse Navigator Note  HF--echocardiogram results are pending at this time.  She presented to the emergency room with worsening shortness of breath, productive cough.  Comorbidities:  Continued tobacco abuse   Medications:  Furosemide 40 mg IV 2 times a day Losartan 50 mg daily   Labs:  Sodium 139, potassium 3.5, chloride 101, CO2 30, BUN 13, creatinine 0.81 Intake 120 mL Output 1775 Weight not documented since admission to the ED.  Assessment:  Katrina Becker is awake and alert sitting up at the bedside, daughter his braiding her hair.  He appears in no acute distress.  HEENT-normocephalic, no JVD noted.  Cardiac-heart tones of regular rate and rhythm.  Musculoskeletal-there is no lower extremity edema noted.  Psych-is pleasant and appropriate.  Neurologic-speech is clear moves extremities without any difficulty.    Initial meeting with patient, daughter and granddaughter.  Discussed the importance of eating low-sodium diet and sticking with the fluid restriction.  Also discussed weighing daily and what to report to physician.  Discussed fluids that she drinks throughout the day.  States that she does not drink alcohol on a daily basis but will have beers on the weekend.  Also had smoked approximately a pack of cigarettes a day but states that she has now quit.  Congratulated on that.  She was given the living with heart failure teaching booklet, zone magnet and information on low-sodium.  She is also given the low-sodium cookbook.   Tresa Endo RN [facility]

## 2021-07-31 NOTE — Progress Notes (Signed)
PROGRESS NOTE    Katrina Becker  XTK:[id number] DOB: 06-12-1961 DOA: 07/29/2021 [facility]    Brief Narrative:  60 y.o. female with medical history significant for   Nicotine dependence and prior documentation of elevated BP as far back as 2017 but without a formal diagnosis of hypertension,who presents with a 2-week history of shortness of breath that has been progressively worsening becoming acutely worse on the night of arrival.  She was initially using a friend's albuterol inhaler with some improvement but over the past couple days it has not helped.  She has an associated cough productive of whitish-yellow phlegm but denies fever or chills.  Denies chest pain.  Denies lower extremity pain or swelling.  Denies nausea, vomiting, abdominal pain or diarrhea.  Admitted under working diagnosis of decompensated congestive heart failure.  Patient has no history of above diagnosis.  Endorses marked improvement in respiratory symptoms since admission and initiation of IV diuresis.   Assessment & Plan:   Principal Problem:   Acute CHF (congestive heart failure) (HCC) Active Problems:   Hypertensive emergency   Nicotine dependence   Essential hypertension   Pulmonary hypertension on CT [facility])  Pulmonary edema associate with shortness of breath Hypertensive emergency Suspected new diagnosis congestive heart failure Acute hypoxic respiratory failure secondary to above - Patient presents with respiratory distress, tachypneic, BP of 173/115, BNP 440s with CTA negative for PE but showing diffuse pulmonary edema.  Strong suspicion for decompensated congestive heart failure Plan: Continue IV diuresis Target net -1-1.5 L daily Continues losartan 50 mg daily As needed hydralazine Follow-up TTE, completed and results pending Wean oxygen as tolerated Tentative plan discharge home 8/12 Continue daily weights and strict I's and O's Anticipate outpatient referral to cardiology    Suspected pulmonary hypertension - Follow-up echocardiogram to quantify degree of pulmonary hypertension     Nicotine dependence - Nicotine patch - Tobacco cessation counseling .  Substance abuse - Patient admits to using cocaine intermittently for years with increased use prior to onset of symptoms - Counseling patient on abstinence. daughter at bedside who reinforced message     DVT prophylaxis: SQ Lovenox Code Status: Full Family Communication: None today Disposition Plan: Status is: Inpatient  Remains inpatient appropriate because:Inpatient level of care appropriate due to severity of illness  Dispo: The patient is from: Home              Anticipated d/c is to: Home              Patient currently is not medically stable to d/c.   Difficult to place patient No       Level of care: Progressive Cardiac  Consultants:  None  Procedures:  None  Antimicrobials:  None   Subjective: Seen and examined.  Still with some cough.  No pain complaints.  Significant provement in symptoms since initiation of diuretic.  Objective: Vitals:   07/30/21 2129 07/31/21 0333 07/31/21 0732 07/31/21 1140  BP: 131/88 134/86 (!) 138/93 113/78  Pulse: 73 75 65 73  Resp: 20 18 18 16   Temp: 98.2 F (36.8 C) 97.9 F (36.6 C) 98.4 F (36.9 C) 98.4 F (36.9 C)  TempSrc:   Oral   SpO2: 94% 94% 93% 92%  Weight:      Height:        Intake/Output Summary (Last 24 hours) at 07/31/2021 1448 Last data filed at 07/31/2021 1330 Gross per 24 hour  Intake 720 ml  Output 3400 ml  Net -2680 ml  Filed Weights   07/29/21 2336  Weight: 79.4 kg    Examination:  General exam: No apparent distress Respiratory system: Bibasilar crackles.  Scattered wheeze.  Normal work of breathing.  2 L Cardiovascular system: S1-S2, regular rate and rhythm, no murmurs, no pedal edema Gastrointestinal system: Nontender, nondistended, normal bowel sounds Central nervous system: Alert and oriented. No  focal neurological deficits. Extremities: Symmetric 5 x 5 power. Skin: No rashes, lesions or ulcers Psychiatry: Judgement and insight appear normal. Mood & affect appropriate.     Data Reviewed: I have personally reviewed following labs and imaging studies  CBC: Recent Labs  Lab 07/29/21 2023 07/31/21 0756  WBC 10.1 9.8  NEUTROABS  --  6.7  HGB 13.6 13.3  HCT 42.3 41.3  MCV 87.4 86.9  PLT 230 240   Basic Metabolic Panel: Recent Labs  Lab 07/29/21 2023 07/30/21 0456 07/31/21 0756  NA 140 139 139  K 3.8 3.2* 3.5  CL 102 107 101  CO2 28 26 30   GLUCOSE 151* 150* 106*  BUN 13 10 13   CREATININE 0.86 0.57 0.81  CALCIUM 8.9 7.1* 8.9   GFR: Estimated Creatinine Clearance: 82.8 mL/min (by C-G formula based on SCr of 0.81 mg/dL). Liver Function Tests: No results for input(s): AST, ALT, ALKPHOS, BILITOT, PROT, ALBUMIN in the last 168 hours. No results for input(s): LIPASE, AMYLASE in the last 168 hours. No results for input(s): AMMONIA in the last 168 hours. Coagulation Profile: No results for input(s): INR, PROTIME in the last 168 hours. Cardiac Enzymes: No results for input(s): CKTOTAL, CKMB, CKMBINDEX, TROPONINI in the last 168 hours. BNP (last 3 results) No results for input(s): PROBNP in the last 8760 hours. HbA1C: No results for input(s): HGBA1C in the last 72 hours. CBG: No results for input(s): GLUCAP in the last 168 hours. Lipid Profile: No results for input(s): CHOL, HDL, LDLCALC, TRIG, CHOLHDL, LDLDIRECT in the last 72 hours. Thyroid Function Tests: No results for input(s): TSH, T4TOTAL, FREET4, T3FREE, THYROIDAB in the last 72 hours. Anemia Panel: No results for input(s): VITAMINB12, FOLATE, FERRITIN, TIBC, IRON, RETICCTPCT in the last 72 hours. Sepsis Labs: No results for input(s): PROCALCITON, LATICACIDVEN in the last 168 hours.  Recent Results (from the past 240 hour(s))  Resp Panel by RT-PCR (Flu A&B, Covid) Nasopharyngeal Swab     Status: None    Collection Time: 07/29/21  8:37 PM   Specimen: Nasopharyngeal Swab; Nasopharyngeal(NP) swabs in vial transport medium  Result Value Ref Range Status   SARS Coronavirus 2 by RT PCR NEGATIVE NEGATIVE Final    Comment: (NOTE) SARS-CoV-2 target nucleic acids are NOT DETECTED.  The SARS-CoV-2 RNA is generally detectable in upper respiratory specimens during the acute phase of infection. The lowest concentration of SARS-CoV-2 viral copies this assay can detect is 138 copies/mL. A negative result does not preclude SARS-Cov-2 infection and should not be used as the sole basis for treatment or other patient management decisions. A negative result may occur with  improper specimen collection/handling, submission of specimen other than nasopharyngeal swab, presence of viral mutation(s) within the areas targeted by this assay, and inadequate number of viral copies(<138 copies/mL). A negative result must be combined with clinical observations, patient history, and epidemiological information. The expected result is Negative.  Fact Sheet for Patients:   Fact Sheet for Healthcare Providers:  09/28/21  This test is no t yet approved or cleared by the [employer] FDA and  has been authorized for detection and/or diagnosis of SARS-CoV-2  by FDA under an Emergency Use Authorization (EUA). This EUA will remain  in effect (meaning this test can be used) for the duration of the COVID-19 declaration under Section 564(b)(1) of the Act, 21 U.S.C.section 360bbb-3(b)(1), unless the authorization is terminated  or revoked sooner.       Influenza A by PCR NEGATIVE NEGATIVE Final   Influenza B by PCR NEGATIVE NEGATIVE Final    Comment: (NOTE) The Xpert Xpress SARS-CoV-2/FLU/RSV plus assay is intended as an aid in the diagnosis of influenza from Nasopharyngeal swab specimens and should not be used as a sole basis for treatment.  Nasal washings and aspirates are unacceptable for Xpert Xpress SARS-CoV-2/FLU/RSV testing.  Fact Sheet for Patients: [employer]  Fact Sheet for Healthcare Providers: [employer]  This test is not yet approved or cleared by the [place] FDA and has been authorized for detection and/or diagnosis of SARS-CoV-2 by FDA under an Emergency Use Authorization (EUA). This EUA will remain in effect (meaning this test can be used) for the duration of the COVID-19 declaration under Section 564(b)(1) of the Act, 21 U.S.C. section 360bbb-3(b)(1), unless the authorization is terminated or revoked.  Performed at [facility], [address], [place], [place] [address]          Radiology Studies: DG Chest 2 View  Result Date: 07/29/2021 CLINICAL DATA:  Shortness of breath EXAM: CHEST - 2 VIEW COMPARISON:  05/27/2019 FINDINGS: Cardiomegaly. Bilateral perihilar and lower lobe airspace disease, likely edema. No effusions. No acute bony abnormality. IMPRESSION: Bilateral perihilar and lower lobe opacities, favor edema/CHF. Electronically Signed   By: Charlett Nose M.D.   On: 07/29/2021 20:29   CT Angio Chest PE W and/or Wo Contrast  Result Date: 07/29/2021 CLINICAL DATA:  Shortness of breath chest pain EXAM: CT ANGIOGRAPHY CHEST WITH CONTRAST TECHNIQUE: Multidetector CT imaging of the chest was performed using the standard protocol during bolus administration of intravenous contrast. Multiplanar CT image reconstructions and MIPs were obtained to evaluate the vascular anatomy. CONTRAST:  OMNIPAQUE IOHEXOL 350 MG/ML SOLN COMPARISON:  Chest x-ray 07/29/2021 FINDINGS: Cardiovascular: Satisfactory opacification of the pulmonary arteries to the segmental level. No evidence of pulmonary embolism. Mild aortic atherosclerosis. No aneurysmal dilatation. Cardiomegaly. No significant pericardial effusion. Dilated pulmonary trunk and main  pulmonary arteries suggesting arterial hypertension. Mediastinum/Nodes: Midline trachea. No thyroid mass. Mild mediastinal adenopathy. Right paratracheal lymph node measures 12 mm. AP window lymph nodes measure up to 14 mm. Esophagus within normal limits Lungs/Pleura: Small bilateral pleural effusions. Diffuse bilateral septal thickening and mild hazy lung density. Upper Abdomen: No acute abnormality. Musculoskeletal: No chest wall abnormality. No acute or significant osseous findings. Review of the MIP images confirms the above findings. IMPRESSION: 1. Negative for acute pulmonary embolus. 2. Cardiomegaly with small bilateral effusions. Diffuse interstitial and hazy pulmonary densities suspected to represent pulmonary edema 3. Mild nonspecific mediastinal adenopathy 4. Dilated main pulmonary vessels suggesting arterial hypertension Aortic Atherosclerosis (ICD10-I70.0). Electronically Signed   By: Jasmine Pang M.D.   On: 07/29/2021 22:21        Scheduled Meds:  benzonatate  200 mg Oral TID   enoxaparin (LOVENOX) injection  40 mg Subcutaneous Q24H   [START ON 08/01/2021] furosemide  40 mg Intravenous Daily   losartan  50 mg Oral Daily   nicotine  14 mg Transdermal Daily   Continuous Infusions:   LOS: 2 days    Time spent: 25 minutes    Tresa Moore, MD [facility] Pager [phone]  If  7PM-7AM, please contact night-coverage 07/31/2021, 2:48 PM

## 2021-08-01 DIAGNOSIS — I5021 Acute systolic (congestive) heart failure: Secondary | ICD-10-CM

## 2021-08-01 LAB — ECHOCARDIOGRAM COMPLETE
Area-P 1/2: 3.99 cm2
Height: 68 in
MV M vel: 5.08 m/s
MV Peak grad: 103.2 mmHg
S' Lateral: 5.1 cm
Weight: 2800 oz

## 2021-08-01 MED ORDER — POTASSIUM CHLORIDE CRYS ER 10 MEQ PO TBCR: 10.0000 meq | EXTENDED_RELEASE_TABLET | Freq: Every day | ORAL | 0 refills | Status: DC

## 2021-08-01 MED ORDER — POTASSIUM CHLORIDE CRYS ER 20 MEQ PO TBCR
40.0000 meq | EXTENDED_RELEASE_TABLET | Freq: Once | ORAL | Status: AC
  Administered 2021-08-01: 40 meq via ORAL
  Filled 2021-08-01: qty 2

## 2021-08-01 MED ORDER — FUROSEMIDE 20 MG PO TABS: 20.0000 mg | ORAL_TABLET | Freq: Every day | ORAL | 0 refills | Status: DC

## 2021-08-01 MED ORDER — LOSARTAN POTASSIUM 50 MG PO TABS: 50.0000 mg | ORAL_TABLET | Freq: Every day | ORAL | 0 refills | Status: DC

## 2021-08-01 MED ORDER — ALUM & MAG HYDROXIDE-SIMETH 200-200-20 MG/5ML PO SUSP
30.0000 mL | ORAL | Status: DC | PRN
  Administered 2021-08-01: 30 mL via ORAL
  Filled 2021-08-01: qty 30

## 2021-08-01 MED ORDER — HYDROXYZINE HCL 10 MG PO TABS: 10.0000 mg | ORAL_TABLET | Freq: Three times a day (TID) | ORAL | 0 refills | Status: AC | PRN

## 2021-08-01 MED ORDER — NICOTINE 14 MG/24HR TD PT24
14.0000 mg | MEDICATED_PATCH | Freq: Every day | TRANSDERMAL | 0 refills | Status: DC
Start: 2021-08-02 — End: 2022-03-25

## 2021-08-01 MED ORDER — BENZONATATE 200 MG PO CAPS: 200.0000 mg | ORAL_CAPSULE | Freq: Three times a day (TID) | ORAL | 0 refills | Status: DC | PRN

## 2021-08-01 NOTE — Plan of Care (Signed)

## 2021-08-01 NOTE — Discharge Summary (Signed)
Physician Discharge Summary  LACHRISHA ZIEBARTH LKG:[id number] DOB: Dec 22, 1960 DOA: 07/29/2021  PCP: [facility]  Admit date: 07/29/2021 Discharge date: 08/01/2021  Admitted From: Home Disposition: Home  Recommendations for Outpatient Follow-up:  Follow up with PCP in 1-2 weeks Referral for [facility] cardiology follow-up  Home Health: No Equipment/Devices: None  Discharge Condition: Stable CODE STATUS: Full Diet recommendation: Heart Healthy  Brief/Interim Summary:  60 y.o. female with medical history significant for   Nicotine dependence and prior documentation of elevated BP as far back as 2017 but without a formal diagnosis of hypertension,who presents with a 2-week history of shortness of breath that has been progressively worsening becoming acutely worse on the night of arrival.  She was initially using a friend's albuterol inhaler with some improvement but over the past couple days it has not helped.  She has an associated cough productive of whitish-yellow phlegm but denies fever or chills.  Denies chest pain.  Denies lower extremity pain or swelling.  Denies nausea, vomiting, abdominal pain or diarrhea.   Admitted under working diagnosis of decompensated congestive heart failure.  Patient has no history of above diagnosis.  Endorses marked improvement in respiratory symptoms since admission and initiation of IV diuresis.  Echocardiogram confirmed systolic dysfunction with EF 45 to 50%.  Patient is diuresed effectively.  At time of discharge will recommend losartan 50 mg daily, Lasix 20 mg daily, K-Dur 10 milliequivalents daily.  Patient will follow-up with [facility] cardiology.  Message sent to attending provider on [facility] cardiology service as well as ambulatory referral placed in chart.  Discharge Diagnoses:  Principal Problem:   Acute CHF (congestive heart failure) (HCC) Active Problems:   Hypertensive emergency   Nicotine dependence   Essential hypertension   Pulmonary hypertension  on CT [facility]) Pulmonary edema associate with shortness of breath Hypertensive emergency New diagnosis acute on chronic systolic congestive heart failure Acute hypoxic respiratory failure secondary to above, resolved - Patient presents with respiratory distress, tachypneic, BP of 173/115, BNP 440s with CTA negative for PE but showing diffuse pulmonary edema.  Echo confirms decompensated systolic congestive heart failure Blood pressure improved Weaned off submental oxygen Discharge plan: Discharge home with outpatient cardiology follow-up Ambulatory referral placed in chart for [facility] Continue losartan 50 mg daily Lasix 20 mg p.o. daily K-Dur 10 milliequivalents daily Referral to outpatient heart failure clinic    Suspected pulmonary hypertension Outpatient follow-up     Nicotine dependence - Nicotine patch - Tobacco cessation counseling .  Substance abuse Patient admits to intermittent cocaine use.  Counseled on cessation   Discharge Instructions  Discharge Instructions     AMB referral to CHF clinic   Complete by: As directed    Ambulatory referral to Cardiology   Complete by: As directed    Diet - low sodium heart healthy   Complete by: As directed    Increase activity slowly   Complete by: As directed       Allergies as of 08/01/2021   No Known Allergies      Medication List     STOP taking these medications    dicyclomine 20 MG tablet Commonly known as: Bentyl       TAKE these medications    ALKA-SELTZER PLUS COLD PO Take by mouth.   aspirin EC 81 MG tablet Take 81 mg by mouth daily. Swallow whole.   benzonatate 200 MG capsule Commonly known as: TESSALON Take 1 capsule (200 mg total) by mouth 3 (three) times daily  as needed for cough.   furosemide 20 MG tablet Commonly known as: Lasix Take 1 tablet (20 mg total) by mouth daily.   hydrOXYzine 10 MG tablet Commonly known as: ATARAX/VISTARIL Take 1 tablet (10 mg total) by mouth 3  (three) times daily as needed for anxiety.   ibuprofen 200 MG tablet Commonly known as: ADVIL Take 400 mg by mouth every 6 (six) hours as needed.   losartan 50 MG tablet Commonly known as: COZAAR Take 1 tablet (50 mg total) by mouth daily. Start taking on: August 02, 2021   nicotine 14 mg/24hr patch Commonly known as: NICODERM CQ - dosed in mg/24 hours Place 1 patch (14 mg total) onto the skin daily. Start taking on: August 02, 2021   potassium chloride 10 MEQ tablet Commonly known as: KLOR-CON Take 1 tablet (10 mEq total) by mouth daily.        Follow-up Information     [facility] Schedule an appointment as soon as possible for a visit in 1 week(s).   Contact information: [address] [place] [address] [phone]         Debbe Odea, MD Follow up.   Specialties: Cardiology, Radiology Why: Referral made to [facility] cardiology.  Their office will contact you for appointment.  In the meantime please follow up with your primary care doctor Contact information: [address] [place] [address] [phone]                No Known Allergies  Consultations: None   Procedures/Studies: DG Chest 2 View  Result Date: 07/29/2021 CLINICAL DATA:  Shortness of breath EXAM: CHEST - 2 VIEW COMPARISON:  05/27/2019 FINDINGS: Cardiomegaly. Bilateral perihilar and lower lobe airspace disease, likely edema. No effusions. No acute bony abnormality. IMPRESSION: Bilateral perihilar and lower lobe opacities, favor edema/CHF. Electronically Signed   By: Charlett Nose M.D.   On: 07/29/2021 20:29   CT Angio Chest PE W and/or Wo Contrast  Result Date: 07/29/2021 CLINICAL DATA:  Shortness of breath chest pain EXAM: CT ANGIOGRAPHY CHEST WITH CONTRAST TECHNIQUE: Multidetector CT imaging of the chest was performed using the standard protocol during bolus administration of intravenous contrast. Multiplanar CT image reconstructions and MIPs were  obtained to evaluate the vascular anatomy. CONTRAST:  OMNIPAQUE IOHEXOL 350 MG/ML SOLN COMPARISON:  Chest x-ray 07/29/2021 FINDINGS: Cardiovascular: Satisfactory opacification of the pulmonary arteries to the segmental level. No evidence of pulmonary embolism. Mild aortic atherosclerosis. No aneurysmal dilatation. Cardiomegaly. No significant pericardial effusion. Dilated pulmonary trunk and main pulmonary arteries suggesting arterial hypertension. Mediastinum/Nodes: Midline trachea. No thyroid mass. Mild mediastinal adenopathy. Right paratracheal lymph node measures 12 mm. AP window lymph nodes measure up to 14 mm. Esophagus within normal limits Lungs/Pleura: Small bilateral pleural effusions. Diffuse bilateral septal thickening and mild hazy lung density. Upper Abdomen: No acute abnormality. Musculoskeletal: No chest wall abnormality. No acute or significant osseous findings. Review of the MIP images confirms the above findings. IMPRESSION: 1. Negative for acute pulmonary embolus. 2. Cardiomegaly with small bilateral effusions. Diffuse interstitial and hazy pulmonary densities suspected to represent pulmonary edema 3. Mild nonspecific mediastinal adenopathy 4. Dilated main pulmonary vessels suggesting arterial hypertension Aortic Atherosclerosis (ICD10-I70.0). Electronically Signed   By: Jasmine Pang M.D.   On: 07/29/2021 22:21   ECHOCARDIOGRAM COMPLETE  Result Date: 08/01/2021    ECHOCARDIOGRAM REPORT   Patient Name:   Katrina Becker Date of Exam: 07/30/2021 Medical Rec #:  [id number]  Height:       68.0 in Accession #:    [id number]        Weight:       175.0 lb Date of Birth:  13-Jul-1961         BSA:          1.931 m Patient Age:    60 years          BP:           131/88 mmHg Patient Gender: F                 HR:           99 bpm. Exam Location:  [facility] Procedure: 2D Echo, Cardiac Doppler and Color Doppler Indications:     I50.31 Acute Diastolic CHF  History:         Patient has no prior  history of Echocardiogram examinations.                  Thyroid Disease.  Sonographer:     Sedonia Small Rodgers-Jones Referring Phys:  [id number] Andris Baumann Diagnosing Phys: Adrian Blackwater IMPRESSIONS  1. Left ventricular ejection fraction, by estimation, is 45 to 50%. The left ventricle has mildly decreased function. The left ventricle demonstrates global hypokinesis. The left ventricular internal cavity size was moderately dilated. There is moderate  left ventricular hypertrophy. Left ventricular diastolic parameters are consistent with Grade I diastolic dysfunction (impaired relaxation).  2. Right ventricular systolic function is mildly reduced. The right ventricular size is mildly enlarged. Mildly increased right ventricular wall thickness.  3. Left atrial size was moderately dilated.  4. Right atrial size was moderately dilated.  5. The mitral valve is myxomatous. Severe mitral valve regurgitation. No evidence of mitral stenosis.  6. The aortic valve is grossly normal. Aortic valve regurgitation is moderate. Mild aortic valve sclerosis is present, with no evidence of aortic valve stenosis.  7. The inferior vena cava is normal in size with greater than 50% respiratory variability, suggesting right atrial pressure of 3 mmHg. Conclusion(s)/Recommendation(s): Findings consistent with severe valvular heart disease. FINDINGS  Left Ventricle: Left ventricular ejection fraction, by estimation, is 45 to 50%. The left ventricle has mildly decreased function. The left ventricle demonstrates global hypokinesis. The left ventricular internal cavity size was moderately dilated. There is moderate left ventricular hypertrophy. Left ventricular diastolic parameters are consistent with Grade I diastolic dysfunction (impaired relaxation). Right Ventricle: The right ventricular size is mildly enlarged. Mildly increased right ventricular wall thickness. Right ventricular systolic function is mildly reduced. Left Atrium: Left atrial  size was moderately dilated. Right Atrium: Right atrial size was moderately dilated. Pericardium: There is no evidence of pericardial effusion. Mitral Valve: The mitral valve is myxomatous. Severe mitral valve regurgitation. No evidence of mitral valve stenosis. Tricuspid Valve: The tricuspid valve is grossly normal. Tricuspid valve regurgitation is mild . No evidence of tricuspid stenosis. Aortic Valve: The aortic valve is grossly normal. Aortic valve regurgitation is moderate. Mild aortic valve sclerosis is present, with no evidence of aortic valve stenosis. Pulmonic Valve: The pulmonic valve was grossly normal. Pulmonic valve regurgitation is mild. No evidence of pulmonic stenosis. Aorta: The aortic root, ascending aorta and aortic arch are all structurally normal, with no evidence of dilitation or obstruction. Venous: The inferior vena cava is normal in size with greater than 50% respiratory variability, suggesting right atrial pressure of 3 mmHg. IAS/Shunts: The interatrial septum was not well visualized.  LEFT VENTRICLE PLAX 2D LVIDd:  6.50 cm  Diastology LVIDs:         5.10 cm  LV e' medial:    4.13 cm/s LV PW:         1.10 cm  LV E/e' medial:  29.8 LV IVS:        0.70 cm  LV e' lateral:   5.77 cm/s LVOT diam:     2.20 cm  LV E/e' lateral: 21.3 LV SV:         46 LV SV Index:   24 LVOT Area:     3.80 cm  RIGHT VENTRICLE             IVC RV Basal diam:  3.50 cm     IVC diam: 2.20 cm RV S prime:     13.40 cm/s TAPSE (M-mode): 2.2 cm LEFT ATRIUM             Index       RIGHT ATRIUM           Index LA diam:        4.90 cm 2.54 cm/m  RA Area:     11.90 cm LA Vol (A2C):   81.2 ml 42.05 ml/m RA Volume:   26.40 ml  13.67 ml/m LA Vol (A4C):   80.4 ml 41.64 ml/m LA Biplane Vol: 84.5 ml 43.76 ml/m  AORTIC VALVE LVOT Vmax:   64.90 cm/s LVOT Vmean:  45.700 cm/s LVOT VTI:    0.120 m  AORTA Ao Root diam: 3.00 cm Ao Asc diam:  3.60 cm MITRAL VALVE MV Area (PHT): 3.99 cm     SHUNTS MV Decel Time: 190 msec      Systemic VTI:  0.12 m MR Peak grad: 103.2 mmHg    Systemic Diam: 2.20 cm MR Mean grad: 73.0 mmHg MR Vmax:      508.00 cm/s MR Vmean:     409.0 cm/s MV E velocity: 123.00 cm/s MV A velocity: 98.10 cm/s MV E/A ratio:  1.25 Shaukat Khan Electronically signed by Adrian Blackwater Signature Date/Time: 08/01/2021/9:12:46 AM    Final    (Echo, Carotid, EGD, Colonoscopy, ERCP)    Subjective: Patient seen and examined at time of discharge.  Stable in no distress.  No shortness of breath.  Discharge Exam: Vitals:   08/01/21 0429 08/01/21 0831  BP: 102/83 116/85  Pulse: 73 78  Resp: 18 18  Temp: 98.3 F (36.8 C) 98.2 F (36.8 C)  SpO2: 97% 99%   Vitals:   07/31/21 1858 07/31/21 1940 08/01/21 0429 08/01/21 0831  BP:  (!) 142/90 102/83 116/85  Pulse:  74 73 78  Resp:  Temp:  99.1 F (37.3 C) 98.3 F (36.8 C) 98.2 F (36.8 C)  TempSrc:  Oral Oral Oral  SpO2:  95% 97% 99%  Weight: 74.7 kg  74.7 kg   Height:        General: Pt is alert, awake, not in acute distress Cardiovascular: RRR, S1/S2 +, no rubs, no gallops Respiratory: CTA bilaterally, no wheezing, no rhonchi Abdominal: Soft, NT, ND, bowel sounds + Extremities: no edema, no cyanosis    The results of significant diagnostics from this hospitalization (including imaging, microbiology, ancillary and laboratory) are listed below for reference.     Microbiology: Recent Results (from the past 240 hour(s))  Resp Panel by RT-PCR (Flu A&B, Covid) Nasopharyngeal Swab     Status: None   Collection Time: 07/29/21  8:37 PM   Specimen: Nasopharyngeal Swab; Nasopharyngeal(NP) swabs  in vial transport medium  Result Value Ref Range Status   SARS Coronavirus 2 by RT PCR NEGATIVE NEGATIVE Final    Comment: (NOTE) SARS-CoV-2 target nucleic acids are NOT DETECTED.  The SARS-CoV-2 RNA is generally detectable in upper respiratory specimens during the acute phase of infection. The lowest concentration of SARS-CoV-2 viral copies this assay  can detect is 138 copies/mL. A negative result does not preclude SARS-Cov-2 infection and should not be used as the sole basis for treatment or other patient management decisions. A negative result may occur with  improper specimen collection/handling, submission of specimen other than nasopharyngeal swab, presence of viral mutation(s) within the areas targeted by this assay, and inadequate number of viral copies(<138 copies/mL). A negative result must be combined with clinical observations, patient history, and epidemiological information. The expected result is Negative.  Fact Sheet for Patients:  BloggerCourse.com  Fact Sheet for Healthcare Providers:  SeriousBroker.it  This test is no t yet approved or cleared by the [place] FDA and  has been authorized for detection and/or diagnosis of SARS-CoV-2 by FDA under an Emergency Use Authorization (EUA). This EUA will remain  in effect (meaning this test can be used) for the duration of the COVID-19 declaration under Section 564(b)(1) of the Act, 21 U.S.C.section 360bbb-3(b)(1), unless the authorization is terminated  or revoked sooner.       Influenza A by PCR NEGATIVE NEGATIVE Final   Influenza B by PCR NEGATIVE NEGATIVE Final    Comment: (NOTE) The Xpert Xpress SARS-CoV-2/FLU/RSV plus assay is intended as an aid in the diagnosis of influenza from Nasopharyngeal swab specimens and should not be used as a sole basis for treatment. Nasal washings and aspirates are unacceptable for Xpert Xpress SARS-CoV-2/FLU/RSV testing.  Fact Sheet for Patients: BloggerCourse.com  Fact Sheet for Healthcare Providers: SeriousBroker.it  This test is not yet approved or cleared by the [place] FDA and has been authorized for detection and/or diagnosis of SARS-CoV-2 by FDA under an Emergency Use Authorization (EUA). This EUA will  remain in effect (meaning this test can be used) for the duration of the COVID-19 declaration under Section 564(b)(1) of the Act, 21 U.S.C. section 360bbb-3(b)(1), unless the authorization is terminated or revoked.  Performed at [facility], [address] [place], [place] [address]      Labs: BNP (last 3 results) Recent Labs    07/29/21 2023  BNP 446.2*   Basic Metabolic Panel: Recent Labs  Lab 07/29/21 2023 07/30/21 0456 07/31/21 0756  NA 140 139 139  K 3.8 3.2* 3.5  CL 102 107 101  CO2 28 26 30   GLUCOSE 151* 150* 106*  BUN 13 10 13   CREATININE 0.86 0.57 0.81  CALCIUM 8.9 7.1* 8.9   Liver Function Tests: No results for input(s): AST, ALT, ALKPHOS, BILITOT, PROT, ALBUMIN in the last 168 hours. No results for input(s): LIPASE, AMYLASE in the last 168 hours. No results for input(s): AMMONIA in the last 168 hours. CBC: Recent Labs  Lab 07/29/21 2023 07/31/21 0756  WBC 10.1 9.8  NEUTROABS  --  6.7  HGB 13.6 13.3  HCT 42.3 41.3  MCV 87.4 86.9  PLT 230 240   Cardiac Enzymes: No results for input(s): CKTOTAL, CKMB, CKMBINDEX, TROPONINI in the last 168 hours. BNP: Invalid input(s): POCBNP CBG: No results for input(s): GLUCAP in the last 168 hours. D-Dimer Recent Labs    07/29/21 2033  DDIMER 1.67*   Hgb A1c No results for input(s): HGBA1C in the last  72 hours. Lipid Profile No results for input(s): CHOL, HDL, LDLCALC, TRIG, CHOLHDL, LDLDIRECT in the last 72 hours. Thyroid function studies No results for input(s): TSH, T4TOTAL, T3FREE, THYROIDAB in the last 72 hours.  Invalid input(s): FREET3 Anemia work up No results for input(s): VITAMINB12, FOLATE, FERRITIN, TIBC, IRON, RETICCTPCT in the last 72 hours. Urinalysis No results found for: COLORURINE, APPEARANCEUR, LABSPEC, PHURINE, GLUCOSEU, HGBUR, BILIRUBINUR, KETONESUR, PROTEINUR, UROBILINOGEN, NITRITE, LEUKOCYTESUR Sepsis Labs Invalid input(s): PROCALCITONIN,  WBC,   LACTICIDVEN Microbiology Recent Results (from the past 240 hour(s))  Resp Panel by RT-PCR (Flu A&B, Covid) Nasopharyngeal Swab     Status: None   Collection Time: 07/29/21  8:37 PM   Specimen: Nasopharyngeal Swab; Nasopharyngeal(NP) swabs in vial transport medium  Result Value Ref Range Status   SARS Coronavirus 2 by RT PCR NEGATIVE NEGATIVE Final    Comment: (NOTE) SARS-CoV-2 target nucleic acids are NOT DETECTED.  The SARS-CoV-2 RNA is generally detectable in upper respiratory specimens during the acute phase of infection. The lowest concentration of SARS-CoV-2 viral copies this assay can detect is 138 copies/mL. A negative result does not preclude SARS-Cov-2 infection and should not be used as the sole basis for treatment or other patient management decisions. A negative result may occur with  improper specimen collection/handling, submission of specimen other than nasopharyngeal swab, presence of viral mutation(s) within the areas targeted by this assay, and inadequate number of viral copies(<138 copies/mL). A negative result must be combined with clinical observations, patient history, and epidemiological information. The expected result is Negative.  Fact Sheet for Patients:  BloggerCourse.comhttps://www.fda.gov/media/152166/download  Fact Sheet for Healthcare Providers:  SeriousBroker.ithttps://www.fda.gov/media/152162/download  This test is no t yet approved or cleared by the [place] FDA and  has been authorized for detection and/or diagnosis of SARS-CoV-2 by FDA under an Emergency Use Authorization (EUA). This EUA will remain  in effect (meaning this test can be used) for the duration of the COVID-19 declaration under Section 564(b)(1) of the Act, 21 U.S.C.section 360bbb-3(b)(1), unless the authorization is terminated  or revoked sooner.       Influenza A by PCR NEGATIVE NEGATIVE Final   Influenza B by PCR NEGATIVE NEGATIVE Final    Comment: (NOTE) The Xpert Xpress SARS-CoV-2/FLU/RSV plus  assay is intended as an aid in the diagnosis of influenza from Nasopharyngeal swab specimens and should not be used as a sole basis for treatment. Nasal washings and aspirates are unacceptable for Xpert Xpress SARS-CoV-2/FLU/RSV testing.  Fact Sheet for Patients: BloggerCourse.comhttps://www.fda.gov/media/152166/download  Fact Sheet for Healthcare Providers: SeriousBroker.ithttps://www.fda.gov/media/152162/download  This test is not yet approved or cleared by the [place] FDA and has been authorized for detection and/or diagnosis of SARS-CoV-2 by FDA under an Emergency Use Authorization (EUA). This EUA will remain in effect (meaning this test can be used) for the duration of the COVID-19 declaration under Section 564(b)(1) of the Act, 21 U.S.C. section 360bbb-3(b)(1), unless the authorization is terminated or revoked.  Performed at [facility], [address] [place], [place] [id number]      Time coordinating discharge: Over 30 minutes  SIGNED:   Tresa MooreSudheer B Raeana Blinn, MD  [facility] 08/01/2021, 12:42 PM Pager   If 7PM-7AM, please contact night-coverage

## 2021-08-01 NOTE — Progress Notes (Signed)
Order to discharge pt home.  Discharge instructions/AVS given to patient and reviewed - education provided as needed.  Pt advised to call PCP and/or come back to the hospital if there are any problems. Pt verbalized understanding.    

## 2021-08-12 NOTE — Progress Notes (Deleted)
   Patient ID: Katrina Becker, female    DOB: 1961-01-05, 60 y.o.   MRN: [id number]  HPI  Katrina Becker is a 60 y/o female with a history of  Echo report from 07/30/21 reviewed and showed an EF of 45-50% along with moderate LVH and severe MR.   Admitted 07/29/21 due to shortness of breath. Initially given IV lasix with transition to oral diuretics. CTA negative for PE. Weaned off of oxygen.                     Discharged after 3 days.   She presents today for her initial visit with a chief complaint of   Review of Systems    Physical Exam  Assessment & Plan:  1: Chronic heart failure with reduced ejection fraction- - NYHA class  - to see cardiology ([facility]-Etang) 09/15/21 - BNP 07/29/21 was 446.2  2: HTN- - BP - BMP 07/31/21 reviewed and showed sodium 139, potassium 3.5, creatinine 0.81 and GFR >60  3: Tobacco use-

## 2021-08-13 ENCOUNTER — Ambulatory Visit: Payer: Self-pay | Admitting: Family

## 2021-08-13 DIAGNOSIS — F1410 Cocaine abuse, uncomplicated: Secondary | ICD-10-CM | POA: Insufficient documentation

## 2021-08-21 ENCOUNTER — Encounter: Payer: Self-pay | Admitting: Family

## 2021-08-21 ENCOUNTER — Ambulatory Visit: Payer: Self-pay | Attending: Family | Admitting: Family

## 2021-08-21 ENCOUNTER — Other Ambulatory Visit: Payer: Self-pay

## 2021-08-21 VITALS — BP 110/62 | HR 78 | Resp 18 | Ht 65.0 in | Wt 165.1 lb

## 2021-08-21 DIAGNOSIS — Z716 Tobacco abuse counseling: Secondary | ICD-10-CM | POA: Insufficient documentation

## 2021-08-21 DIAGNOSIS — I110 Hypertensive heart disease with heart failure: Secondary | ICD-10-CM | POA: Insufficient documentation

## 2021-08-21 DIAGNOSIS — Z79899 Other long term (current) drug therapy: Secondary | ICD-10-CM | POA: Insufficient documentation

## 2021-08-21 DIAGNOSIS — I5022 Chronic systolic (congestive) heart failure: Secondary | ICD-10-CM | POA: Insufficient documentation

## 2021-08-21 DIAGNOSIS — Z720 Tobacco use: Secondary | ICD-10-CM

## 2021-08-21 DIAGNOSIS — R519 Headache, unspecified: Secondary | ICD-10-CM | POA: Insufficient documentation

## 2021-08-21 DIAGNOSIS — R42 Dizziness and giddiness: Secondary | ICD-10-CM | POA: Insufficient documentation

## 2021-08-21 DIAGNOSIS — Z7289 Other problems related to lifestyle: Secondary | ICD-10-CM | POA: Insufficient documentation

## 2021-08-21 DIAGNOSIS — E079 Disorder of thyroid, unspecified: Secondary | ICD-10-CM | POA: Insufficient documentation

## 2021-08-21 DIAGNOSIS — F17210 Nicotine dependence, cigarettes, uncomplicated: Secondary | ICD-10-CM | POA: Insufficient documentation

## 2021-08-21 DIAGNOSIS — I10 Essential (primary) hypertension: Secondary | ICD-10-CM

## 2021-08-21 DIAGNOSIS — F419 Anxiety disorder, unspecified: Secondary | ICD-10-CM | POA: Insufficient documentation

## 2021-08-21 DIAGNOSIS — R059 Cough, unspecified: Secondary | ICD-10-CM | POA: Insufficient documentation

## 2021-08-21 MED ORDER — LOSARTAN POTASSIUM 50 MG PO TABS: 50.0000 mg | ORAL_TABLET | Freq: Every day | ORAL | 5 refills | Status: AC

## 2021-08-21 MED ORDER — POTASSIUM CHLORIDE CRYS ER 10 MEQ PO TBCR: 10.0000 meq | EXTENDED_RELEASE_TABLET | Freq: Every day | ORAL | 5 refills | Status: DC

## 2021-08-21 MED ORDER — FUROSEMIDE 20 MG PO TABS: 20.0000 mg | ORAL_TABLET | Freq: Every day | ORAL | 5 refills | Status: AC

## 2021-08-21 NOTE — Patient Instructions (Signed)
Continue weighing daily and call for an overnight weight gain of > 2 pounds or a weekly weight gain of >5 pounds. 

## 2021-08-21 NOTE — Progress Notes (Signed)
Patient ID: Katrina Becker, female    DOB: 04/28/1961, 60 y.o.   MRN: [id number]  HPI  Katrina Becker is a 60 y/o female with a history of HTN, thyroid disease, current tobacco use and chronic heart failure.   Echo report from 07/30/21 reviewed and showed an EF of 45-50% along with moderate LVH and severe MR.   Admitted 07/29/21 due to shortness of breath. Initially given IV lasix with transition to oral diuretics. CTA negative for PE. Weaned off of oxygen. Discharged after 3 days.   She presents today for her initial visit with a chief complaint of intermittent cough. She describes this as having been present for several months although is markedly improved from recent admission. She has associated light-headedness, anxiety and joint pain along with this. She denies any difficulty sleeping, fatigue, shortness of breath, chest pain, pedal edema, palpitations, abdominal distention or weight gain.   Has upcoming PCP appointment at [facility] on 08/29/21  Past Medical History:  Diagnosis Date   CHF (congestive heart failure) (HCC)    Hypertension    Thyroid disease    Past Surgical History:  Procedure Laterality Date   ABDOMINAL HYSTERECTOMY     CESAREAN SECTION     No family history on file. Social History   Tobacco Use   Smoking status: Every Day    Packs/day: 1.00    Types: Cigarettes   Smokeless tobacco: Never  Substance Use Topics   Alcohol use: Yes    Alcohol/week: 9.0 standard drinks    Types: 9 Standard drinks or equivalent per week   No Known Allergies Prior to Admission medications   Medication Sig Start Date End Date Taking? Authorizing Provider  aspirin EC 81 MG tablet Take 81 mg by mouth daily. Swallow whole.   Yes [provider]  benzonatate (TESSALON) 200 MG capsule Take 1 capsule (200 mg total) by mouth 3 (three) times daily as needed for cough. 08/01/21  Yes Sreenath, Jonelle Sports, MD  Chlorphen-Phenyleph-ASA (ALKA-SELTZER PLUS COLD PO) Take by mouth.   Yes  [provider]  furosemide (LASIX) 20 MG tablet Take 1 tablet (20 mg total) by mouth daily. 08/01/21 08/31/21 Yes Sreenath, Sudheer B, MD  hydrOXYzine (ATARAX/VISTARIL) 10 MG tablet Take 1 tablet (10 mg total) by mouth 3 (three) times daily as needed for anxiety. 08/01/21  Yes Sreenath, Sudheer B, MD  ibuprofen (ADVIL) 200 MG tablet Take 400 mg by mouth every 6 (six) hours as needed.   Yes [provider]  losartan (COZAAR) 50 MG tablet Take 1 tablet (50 mg total) by mouth daily. 08/02/21 09/01/21 Yes Sreenath, Sudheer B, MD  nicotine (NICODERM CQ - DOSED IN MG/24 HOURS) 14 mg/24hr patch Place 1 patch (14 mg total) onto the skin daily. 08/02/21  Yes Sreenath, Sudheer B, MD  potassium chloride (KLOR-CON) 10 MEQ tablet Take 1 tablet (10 mEq total) by mouth daily. 08/01/21 08/31/21 Yes Tresa Moore, MD   Review of Systems  Constitutional:  Negative for appetite change and fatigue.  HENT:  Negative for congestion, postnasal drip and sore throat.   Eyes: Negative.   Respiratory:  Positive for cough. Negative for chest tightness and shortness of breath.   Cardiovascular:  Negative for chest pain, palpitations and leg swelling.  Gastrointestinal:  Negative for abdominal distention and abdominal pain.  Endocrine: Negative.   Genitourinary: Negative.   Musculoskeletal:  Positive for arthralgias (joint pain). Negative for back pain.  Skin: Negative.   Allergic/Immunologic: Negative.   Neurological:  Positive for light-headedness. Negative for dizziness.  Hematological:  Negative for adenopathy. Does not bruise/bleed easily.  Psychiatric/Behavioral:  Negative for dysphoric mood and sleep disturbance (sleeping on 2 pillows). The patient is nervous/anxious.    Vitals:   08/21/21 1411 08/21/21 1441 08/21/21 1442  BP: (!) 86/64 122/70 110/62  Pulse: 78    Resp: 18    SpO2: 99%    Weight: 165 lb 2 oz (74.9 kg)    Height: 5\' 5"  (1.651 m)     Wt Readings from Last 3 Encounters:   08/21/21 165 lb 2 oz (74.9 kg)  08/01/21 164 lb 11.2 oz (74.7 kg)  05/27/19 176 lb (79.8 kg)   Lab Results  Component Value Date   CREATININE 0.81 07/31/2021   CREATININE 0.57 07/30/2021   CREATININE 0.86 07/29/2021    Physical Exam Vitals and nursing note reviewed.  Constitutional:      Appearance: Normal appearance.  HENT:     Head: Normocephalic and atraumatic.  Cardiovascular:     Rate and Rhythm: Normal rate and regular rhythm.  Pulmonary:     Effort: Pulmonary effort is normal. No respiratory distress.     Breath sounds: No wheezing or rales.  Abdominal:     General: Abdomen is flat. There is no distension.     Palpations: Abdomen is soft.  Musculoskeletal:        General: No tenderness.     Cervical back: Neck supple.     Right lower leg: No edema.     Left lower leg: No edema.  Skin:    General: Skin is warm and dry.  Neurological:     General: No focal deficit present.     Mental Status: She is alert and oriented to person, place, and time.  Psychiatric:        Mood and Affect: Mood is anxious.        Behavior: Behavior normal.        Thought Content: Thought content normal.   Assessment & Plan:  1: Chronic heart failure with reduced ejection fraction- - NYHA class I - euvolemic today - weighing daily; reminded to call for an overnight weight gain of > 2 pounds or a weekly weight gain of > 5 pounds - not adding salt except sometimes to turnip greens while she is cooking them but doesn't add any afterwards; reading food labels for sodium content and understands the importance of keeping daily sodium intake to around 2000mg  / day - drinking ~ 60 ounces of fluid daily - to see cardiology ([facility]) 09/15/21 - on GDMT of losartan - unsure if BP could tolerate changing this to entresto or even to add other GDMT - BNP 07/29/21 was 446.2  2: HTN- - BP initially 86/64 but then when rechecked with manual cuff at the end of the visit was 122/70 in the left arm  and 110/62 in the right arm - advised her to check her BP daily at home at varying times of the day, write the BP readings down and bring log to appointments - sees PCP at [facility] 08/29/21 - BMP 07/31/21 reviewed and showed sodium 139, potassium 3.5, creatinine 0.81 and GFR >60  3: Tobacco use- - smoking 1 ppd of cigarettes; had quit previously for 6 years but then resumed after her husband passed away with cancer - drinks 1 can of beer daily; reports drinking more than in the past - did do cocaine for ~ 6 weeks leading up to admission; has not  done this since - complete cessation of all was discussed for 3 minutes with her  Medication bottles reviewed.   Return in 6 weeks or sooner for any questions/problems before then.

## 2021-09-15 ENCOUNTER — Ambulatory Visit: Payer: Self-pay | Admitting: Cardiology

## 2021-09-30 NOTE — Progress Notes (Deleted)
Patient ID: Katrina Becker, female    DOB: 02-17-1961, 60 y.o.   MRN: [id number]  HPI  Ms Katrina Becker is a 60 y/o female with a history of HTN, thyroid disease, current tobacco use and chronic heart failure.   Echo report from 07/30/21 reviewed and showed an EF of 45-50% along with moderate LVH and severe MR.   Admitted 07/29/21 due to shortness of breath. Initially given IV lasix with transition to oral diuretics. CTA negative for PE. Weaned off of oxygen. Discharged after 3 days.   She presents today for a follow-up visit with a chief complaint of   Past Medical History:  Diagnosis Date   CHF (congestive heart failure) (HCC)    Hypertension    Thyroid disease    Past Surgical History:  Procedure Laterality Date   ABDOMINAL HYSTERECTOMY     CESAREAN SECTION     No family history on file. Social History   Tobacco Use   Smoking status: Every Day    Packs/day: 1.00    Types: Cigarettes   Smokeless tobacco: Never  Substance Use Topics   Alcohol use: Yes    Alcohol/week: 9.0 standard drinks    Types: 9 Standard drinks or equivalent per week   No Known Allergies  Review of Systems  Constitutional:  Negative for appetite change and fatigue.  HENT:  Negative for congestion, postnasal drip and sore throat.   Eyes: Negative.   Respiratory:  Positive for cough. Negative for chest tightness and shortness of breath.   Cardiovascular:  Negative for chest pain, palpitations and leg swelling.  Gastrointestinal:  Negative for abdominal distention and abdominal pain.  Endocrine: Negative.   Genitourinary: Negative.   Musculoskeletal:  Positive for arthralgias (joint pain). Negative for back pain.  Skin: Negative.   Allergic/Immunologic: Negative.   Neurological:  Positive for light-headedness. Negative for dizziness.  Hematological:  Negative for adenopathy. Does not bruise/bleed easily.  Psychiatric/Behavioral:  Negative for dysphoric mood and sleep disturbance (sleeping on 2  pillows). The patient is nervous/anxious.       Physical Exam Vitals and nursing note reviewed.  Constitutional:      Appearance: Normal appearance.  HENT:     Head: Normocephalic and atraumatic.  Cardiovascular:     Rate and Rhythm: Normal rate and regular rhythm.  Pulmonary:     Effort: Pulmonary effort is normal. No respiratory distress.     Breath sounds: No wheezing or rales.  Abdominal:     General: Abdomen is flat. There is no distension.     Palpations: Abdomen is soft.  Musculoskeletal:        General: No tenderness.     Cervical back: Neck supple.     Right lower leg: No edema.     Left lower leg: No edema.  Skin:    General: Skin is warm and dry.  Neurological:     General: No focal deficit present.     Mental Status: She is alert and oriented to person, place, and time.  Psychiatric:        Mood and Affect: Mood is anxious.        Behavior: Behavior normal.        Thought Content: Thought content normal.   Assessment & Plan:  1: Chronic heart failure with reduced ejection fraction- - NYHA class I - euvolemic today - weighing daily; reminded to call for an overnight weight gain of > 2 pounds or a weekly weight gain of > 5 pounds -  not adding salt except sometimes to turnip greens while she is cooking them but doesn't add any afterwards; reading food labels for sodium content and understands the importance of keeping daily sodium intake to around 2000mg  / day - drinking ~ 60 ounces of fluid daily - saw cardiology ) 09/11/21 - on GDMT of losartan - unsure if BP could tolerate changing this to entresto or even to add other GDMT - BNP 09/11/21 was 42.72  2: HTN- - BP  - saw PCP 09/13/21) at [facility] 08/29/21 - BMP 08/29/21 reviewed and showed sodium 137, potassium 4.1, creatinine 0.82 and GFR 83  3: Tobacco use- - smoking 1 ppd of cigarettes; had quit previously for 6 years but then resumed after her husband passed away with cancer - drinks 1 can of beer daily;  reports drinking more than in the past - did do cocaine for ~ 6 weeks leading up to admission; has not done this since - complete cessation of all was discussed for 3 minutes with her   Medication bottles reviewed.

## 2021-10-01 ENCOUNTER — Telehealth: Payer: Self-pay | Admitting: Family

## 2021-10-01 ENCOUNTER — Ambulatory Visit: Payer: Self-pay | Admitting: Family

## 2021-10-01 NOTE — Telephone Encounter (Signed)
Patient Called and LVM regarding her missed [facility]F Clinic appointment for today. She stated she mentioned in the past that all her doctors have been transferred to [facility] and she will no longer be getting care through [facility] for any future appointments.   Katrina Becker, NT

## 2022-01-20 ENCOUNTER — Other Ambulatory Visit: Payer: Self-pay | Admitting: Family

## 2022-02-17 ENCOUNTER — Ambulatory Visit (INDEPENDENT_AMBULATORY_CARE_PROVIDER_SITE_OTHER): Payer: Self-pay | Admitting: Family Medicine

## 2022-02-17 ENCOUNTER — Other Ambulatory Visit: Payer: Self-pay

## 2022-02-17 ENCOUNTER — Encounter: Payer: Self-pay | Admitting: Family Medicine

## 2022-02-17 ENCOUNTER — Ambulatory Visit
Admission: RE | Admit: 2022-02-17 | Discharge: 2022-02-17 | Disposition: A | Discharge status: Home / Self Care | Payer: Self-pay | Source: Ambulatory Visit | Attending: Family Medicine | Admitting: Family Medicine

## 2022-02-17 ENCOUNTER — Ambulatory Visit
Admission: RE | Admit: 2022-02-17 | Discharge: 2022-02-17 | Disposition: A | Discharge status: Home / Self Care | Payer: Self-pay | Attending: Family Medicine | Admitting: Family Medicine

## 2022-02-17 VITALS — BP 118/68 | HR 76 | Ht 65.0 in | Wt 180.0 lb

## 2022-02-17 DIAGNOSIS — M25551 Pain in right hip: Secondary | ICD-10-CM

## 2022-02-17 DIAGNOSIS — G8929 Other chronic pain: Secondary | ICD-10-CM | POA: Insufficient documentation

## 2022-02-17 MED ORDER — MELOXICAM 15 MG PO TABS: 15.0000 mg | ORAL_TABLET | Freq: Every day | ORAL | 0 refills | Status: DC

## 2022-02-17 NOTE — Progress Notes (Signed)
°  ° °  Primary Care / Sports Medicine Office Visit  Patient Information:  Patient ID: Katrina Becker, female DOB: September 27, 1961 Age: 61 y.o. MRN: [id number]   Katrina Becker is a pleasant 61 y.o. female presenting with the following:  Chief Complaint  Patient presents with   New Patient (Initial Visit)   Hip Pain    Right; x1 year; no known injury or trauma; no imaging; treatments include: ibuprofen, Tylenol, Voltaren gel with no relief; 8/10 pain    Vitals:   02/17/22 0943  BP: 118/68  Pulse: 76  SpO2: 96%   Vitals:   02/17/22 0943  Weight: 180 lb (81.6 kg)  Height: 5\' 5"  (1.651 m)   Body mass index is 29.95 kg/m.  No results found.   Independent interpretation of notes and tests performed by another provider:   None  Procedures performed:   None  Pertinent History, Exam, Impression, and Recommendations:   Chronic right hip pain Patient presenting with chronic (greater than 1 year) atraumatic right groin pain, aggravated primarily with weightbearing, towards the end of the day, additionally significant stiffness first thing in the morning and after a period of immobility.  Pain localized to the right groin, some radiation laterally to the lateral hip, denies any radiation into the thigh, to the low back, no paresthesias.  Treatments to date have included OTC medications with limited response.  Physical examination reveals significant tightness with mobilization of the low back and right hip in all directions, there is groin tenderness, positive Faber, equivocal FADIR, resisted hip flexion is maximally symptomatic with pain localizing to the right groin.  She has a negative straight leg raise and negative piriformis testing.  She has mild tenderness about the right greater trochanter, but otherwise nontender elsewhere.  Her clinical history and findings are most consistent with concern for either right hip joint itself versus psoas tendon across the right hip joint, can  also be considered secondary to low back etiology.  Plan for dedicated x-rays of the lumbar spine and right hip/AP pelvis, and initiation of scheduled meloxicam, activity as tolerated, close follow-up in 4 weeks.   Orders & Medications Meds ordered this encounter  Medications   meloxicam (MOBIC) 15 MG tablet    Sig: Take 1 tablet (15 mg total) by mouth daily.    Dispense:  30 tablet    Refill:  0   Orders Placed This Encounter  Procedures   DG Hip Unilat W OR W/O Pelvis 2-3 Views Right   DG Lumbar Spine Complete     Return in about 4 weeks (around 03/17/2022).     03/19/2022, MD   Primary Care Sports Medicine [facility]

## 2022-02-17 NOTE — Patient Instructions (Signed)
-   Go downstairs and obtain x-rays today of the low back and right hip - Start meloxicam once daily, take with food (no other NSAIDs while on this medication, example ibuprofen/naproxen) - For breakthrough pain Tylenol is okay (acetaminophen) - Continue with activity as tolerated using hip symptoms as a guide - Return for follow-up in 4 weeks - Contact us for any questions between now and then

## 2022-02-17 NOTE — Assessment & Plan Note (Signed)
Patient presenting with chronic (greater than 1 year) atraumatic right groin pain, aggravated primarily with weightbearing, towards the end of the day, additionally significant stiffness first thing in the morning and after a period of immobility.  Pain localized to the right groin, some radiation laterally to the lateral hip, denies any radiation into the thigh, to the low back, no paresthesias.  Treatments to date have included OTC medications with limited response.  Physical examination reveals significant tightness with mobilization of the low back and right hip in all directions, there is groin tenderness, positive Faber, equivocal FADIR, resisted hip flexion is maximally symptomatic with pain localizing to the right groin.  She has a negative straight leg raise and negative piriformis testing.  She has mild tenderness about the right greater trochanter, but otherwise nontender elsewhere.  Her clinical history and findings are most consistent with concern for either right hip joint itself versus psoas tendon across the right hip joint, can also be considered secondary to low back etiology.  Plan for dedicated x-rays of the lumbar spine and right hip/AP pelvis, and initiation of scheduled meloxicam, activity as tolerated, close follow-up in 4 weeks.

## 2022-03-17 ENCOUNTER — Ambulatory Visit: Payer: Self-pay | Admitting: Family Medicine

## 2022-03-20 ENCOUNTER — Ambulatory Visit: Payer: Self-pay | Admitting: Family Medicine

## 2022-03-23 ENCOUNTER — Ambulatory Visit: Payer: Self-pay | Admitting: Family Medicine

## 2022-03-25 ENCOUNTER — Ambulatory Visit (INDEPENDENT_AMBULATORY_CARE_PROVIDER_SITE_OTHER): Payer: 59 | Admitting: Family Medicine

## 2022-03-25 ENCOUNTER — Encounter: Payer: Self-pay | Admitting: Family Medicine

## 2022-03-25 VITALS — BP 132/78 | HR 71 | Ht 65.0 in | Wt 177.0 lb

## 2022-03-25 DIAGNOSIS — M1611 Unilateral primary osteoarthritis, right hip: Secondary | ICD-10-CM | POA: Diagnosis not present

## 2022-03-25 MED ORDER — MELOXICAM 7.5 MG PO TABS: 7.5000 mg | ORAL_TABLET | Freq: Every day | ORAL | 0 refills | Status: DC

## 2022-03-25 NOTE — Assessment & Plan Note (Signed)
Patient presents for follow-up to right chronic atraumatic groin pain, at her last visit on 02/17/2022, her examination was most consistent with right hip arthralgia versus psoas tendinopathy, advised x-rays, scheduled meloxicam, and close follow-up. ?

## 2022-03-25 NOTE — Patient Instructions (Signed)
-   Schedule follow-up for right hip ?- Can transition to meloxicam 7.5 mg, can take 1-2 tablets daily on an as-needed basis (take with food, no other NSAIDs while on this medication) ?- Anticipate starting physical therapy after injection, I will send referral in today ?- Contact for any questions between now and then ?

## 2022-03-25 NOTE — Progress Notes (Signed)
?  ? ?  Primary Care / Sports Medicine Office Visit ? ?Patient Information:  ?Patient ID: Katrina Becker, female DOB: 07-18-1961 Age: 61 y.o. MRN: [id number]  ? ?Katrina Becker is a pleasant 61 y.o. female presenting with the following: ? ?Chief Complaint  ?Patient presents with  ? Hip Pain  ?  Right- got xray, no pain just stiff, medication does help.   ? ? ?Vitals:  ? 03/25/22 0951  ?BP: 132/78  ?Pulse: 71  ?SpO2: 94%  ? ?Vitals:  ? 03/25/22 0951  ?Weight: 177 lb (80.3 kg)  ?Height: 5\' 5"  (1.651 m)  ? ?Body mass index is 29.45 kg/m?. ? ?No results found.  ? ?Independent interpretation of notes and tests performed by another provider:  ? ?Independent interpretation of recent right hip x-rays demonstrates severe degenerative changes of the right hip with acetabular spurring and joint space loss, no acute osseous processes noted. ? ?Procedures performed:  ? ?None ? ?Pertinent History, Exam, Impression, and Recommendations:  ? ?Primary osteoarthritis of right hip ?Patient presents for follow-up to right chronic atraumatic groin pain, at her last visit on 02/17/2022, her examination was most consistent with right hip arthralgia versus psoas tendinopathy, advised x-rays, scheduled meloxicam, and close follow-up.  ? ?Orders & Medications ?Meds ordered this encounter  ?Medications  ? DISCONTD: meloxicam (MOBIC) 7.5 MG tablet  ?  Sig: Take 1-2 tablets (7.5-15 mg total) by mouth daily.  ?  Dispense:  30 tablet  ?  Refill:  0  ? meloxicam (MOBIC) 7.5 MG tablet  ?  Sig: Take 1-2 tablets (7.5-15 mg total) by mouth daily.  ?  Dispense:  30 tablet  ?  Refill:  0  ? ?Orders Placed This Encounter  ?Procedures  ? Ambulatory referral to Physical Therapy  ?  ? ?No follow-ups on file.  ?  ? ?Montel Culver, MD ? ? Primary Care Sports Medicine ?[facility] ?[place]  ? ?

## 2022-03-26 ENCOUNTER — Other Ambulatory Visit: Payer: Self-pay | Admitting: Family Medicine

## 2022-03-26 DIAGNOSIS — G8929 Other chronic pain: Secondary | ICD-10-CM

## 2022-03-27 NOTE — Telephone Encounter (Signed)
rx dose was changed on 03/25/22 by provider.  ?Requested Prescriptions  ?Pending Prescriptions Disp Refills  ?? meloxicam (MOBIC) 15 MG tablet [Pharmacy Med Name: MELOXICAM 15 MG TABLET] 30 tablet 0  ?  Sig: TAKE ONE TABLET BY MOUTH DAILY  ?  ? Analgesics:  COX2 Inhibitors Failed - 03/26/2022  1:08 PM  ?  ?  Failed - Manual Review: Labs are only required if the patient has taken medication for more than 8 weeks.  ?  ?  Failed - AST in normal range and within 360 days  ?  No results found for: POCAST, AST   ?  ?  Failed - ALT in normal range and within 360 days  ?  No results found for: ALT, LABALT, POCALT   ?  ?  Passed - HGB in normal range and within 360 days  ?  Hemoglobin  ?Date Value Ref Range Status  ?07/31/2021 13.3 12.0 - 15.0 g/dL Final  ?   ?  ?  Passed - Cr in normal range and within 360 days  ?  Creatinine, Ser  ?Date Value Ref Range Status  ?07/31/2021 0.81 0.44 - 1.00 mg/dL Final  ?   ?  ?  Passed - HCT in normal range and within 360 days  ?  HCT  ?Date Value Ref Range Status  ?07/31/2021 41.3 36.0 - 46.0 % Final  ?   ?  ?  Passed - eGFR is 30 or above and within 360 days  ?  GFR calc Af Amer  ?Date Value Ref Range Status  ?05/27/2019 >60 >60 mL/min Final  ? ?GFR, Estimated  ?Date Value Ref Range Status  ?07/31/2021 >60 >60 mL/min Final  ?  Comment:  ?  (NOTE) ?Calculated using the CKD-EPI Creatinine Equation (2021) ?  ?   ?  ?  Passed - Patient is not pregnant  ?  ?  Passed - Valid encounter within last 12 months  ?  Recent Outpatient Visits   ?      ? 2 days ago Primary osteoarthritis of right hip  ? [facility] Montel Culver, MD  ? 1 month ago Chronic right hip pain  ? [facility] Montel Culver, MD  ?  ?  ? ?  ?  ?  ? ? ?

## 2022-04-06 ENCOUNTER — Encounter: Payer: Self-pay | Admitting: Ophthalmology

## 2022-04-07 ENCOUNTER — Other Ambulatory Visit: Payer: Self-pay | Admitting: Family Medicine

## 2022-04-07 DIAGNOSIS — M1611 Unilateral primary osteoarthritis, right hip: Secondary | ICD-10-CM

## 2022-04-07 NOTE — Telephone Encounter (Signed)
rx was sent in by provider on 03/25/22 #30/0 ?Requested Prescriptions  ?Pending Prescriptions Disp Refills  ?? meloxicam (MOBIC) 7.5 MG tablet [Pharmacy Med Name: MELOXICAM 7.5 MG TABLET] 30 tablet 0  ?  Sig: TAKE ONE TO TWO TABLETS BY MOUTH EVERY DAY  ?  ? Analgesics:  COX2 Inhibitors Failed - 04/07/2022  6:21 AM  ?  ?  Failed - Manual Review: Labs are only required if the patient has taken medication for more than 8 weeks.  ?  ?  Failed - AST in normal range and within 360 days  ?  No results found for: POCAST, AST   ?  ?  Failed - ALT in normal range and within 360 days  ?  No results found for: ALT, LABALT, POCALT   ?  ?  Passed - HGB in normal range and within 360 days  ?  Hemoglobin  ?Date Value Ref Range Status  ?07/31/2021 13.3 12.0 - 15.0 g/dL Final  ?   ?  ?  Passed - Cr in normal range and within 360 days  ?  Creatinine, Ser  ?Date Value Ref Range Status  ?07/31/2021 0.81 0.44 - 1.00 mg/dL Final  ?   ?  ?  Passed - HCT in normal range and within 360 days  ?  HCT  ?Date Value Ref Range Status  ?07/31/2021 41.3 36.0 - 46.0 % Final  ?   ?  ?  Passed - eGFR is 30 or above and within 360 days  ?  GFR calc Af Amer  ?Date Value Ref Range Status  ?05/27/2019 >60 >60 mL/min Final  ? ?GFR, Estimated  ?Date Value Ref Range Status  ?07/31/2021 >60 >60 mL/min Final  ?  Comment:  ?  (NOTE) ?Calculated using the CKD-EPI Creatinine Equation (2021) ?  ?   ?  ?  Passed - Patient is not pregnant  ?  ?  Passed - Valid encounter within last 12 months  ?  Recent Outpatient Visits   ?      ? 1 week ago Primary osteoarthritis of right hip  ? [facility] Montel Culver, MD  ? 1 month ago Chronic right hip pain  ? [facility] Montel Culver, MD  ?  ?  ? ?  ?  ?  ? ? ?

## 2022-04-09 ENCOUNTER — Ambulatory Visit (INDEPENDENT_AMBULATORY_CARE_PROVIDER_SITE_OTHER): Payer: 59 | Admitting: Family Medicine

## 2022-04-09 ENCOUNTER — Inpatient Hospital Stay (INDEPENDENT_AMBULATORY_CARE_PROVIDER_SITE_OTHER): Payer: 59 | Admitting: Radiology

## 2022-04-09 ENCOUNTER — Encounter: Payer: Self-pay | Admitting: Family Medicine

## 2022-04-09 VITALS — BP 118/78 | HR 72 | Ht 65.0 in | Wt 177.0 lb

## 2022-04-09 DIAGNOSIS — M1611 Unilateral primary osteoarthritis, right hip: Secondary | ICD-10-CM

## 2022-04-09 DIAGNOSIS — M25551 Pain in right hip: Secondary | ICD-10-CM

## 2022-04-09 MED ORDER — CELECOXIB 200 MG PO CAPS: ORAL_CAPSULE | ORAL | 0 refills | Status: DC

## 2022-04-09 MED ORDER — TRIAMCINOLONE ACETONIDE 40 MG/ML IJ SUSP
80.0000 mg | Freq: Once | INTRAMUSCULAR | Status: AC
  Administered 2022-04-09: 80 mg via INTRAMUSCULAR

## 2022-04-09 NOTE — Assessment & Plan Note (Signed)
Focal tenderness to the right greater trochanteric region of the setting of underlying right hip osteoarthritis, this can be considered a secondary/compensatory change.  Given its recalcitrant nature despite meloxicam initial management, did discuss additional treatment strategies and she did elect to proceed with ultrasound-guided cortisone injection to the right greater trochanteric region.  She will start a home-based rehab program next week, utilize as needed Celebrex, and can follow-up as needed. ?

## 2022-04-09 NOTE — Patient Instructions (Signed)
You have just been given a cortisone injection to reduce pain and inflammation. After the injection you may notice immediate relief of pain as a result of the Lidocaine. It is important to rest the area of the injection for 24 to 48 hours after the injection. There is a possibility of some temporary increased discomfort and swelling for up to 72 hours until the cortisone begins to work. If you do have pain, simply rest the joint and use ice. If you can tolerate over the counter medications, you can try Tylenol for added relief per package instructions. ?-As above, take relative rest x2 days and gradual return to normal activity ?- Can use Celebrex up to twice a day on as-needed basis for pain until symptoms respond to cortisone and as an adjunct to home exercises ?- Start home exercises next week and focus on gradual progression ?- Contact us for questions and follow-up as needed ? ?

## 2022-04-09 NOTE — Assessment & Plan Note (Signed)
Chronic condition that has remained symptomatic despite meloxicam titration.  Given the extent of involvement on x-rays, physical examination findings today which show persistent focality to the right hip joint, secondarily to the greater trochanteric region, we discussed additional treatment strategies.  Patient did elect to proceed with ultrasound-guided right hip cortisone injection intra-articularly.  She is to start a home-based rehab program next week, use adjunct Celebrex, and can follow-up as needed.  We can repeat cortisone in 3 months if indicated. ? ?Chronic condition, symptomatic, Rx management ?

## 2022-04-09 NOTE — Discharge Instructions (Signed)

## 2022-04-09 NOTE — Progress Notes (Signed)
?  ? ?Primary Care / Sports Medicine Office Visit ? ?Patient Information:  ?Patient ID: Katrina Becker, female DOB: 06-01-1961 Age: 61 y.o. MRN: [id number]  ? ?Katrina Becker is a pleasant 61 y.o. female presenting with the following: ? ?Chief Complaint  ?Patient presents with  ? Osteoarthritis right hip  ? ? ?Vitals:  ? 04/09/22 1023  ?BP: 118/78  ?Pulse: 72  ?SpO2: 98%  ? ?Vitals:  ? 04/09/22 1023  ?Weight: 177 lb (80.3 kg)  ?Height: 5\' 5"  (1.651 m)  ? ?Body mass index is 29.45 kg/m?. ? ?No results found.  ? ?Independent interpretation of notes and tests performed by another provider:  ? ?None ? ?Procedures performed:  ? ?Procedure:  Injection of right hip joint under ultrasound guidance. ?Ultrasound guidance utilized for in-plane approach to the right hip joint, cortical irregularity at the visualized femoral head/neck consistent with degenerative changes noted ?Samsung HS60 device utilized with permanent recording / reporting. ?Verbal informed consent obtained and verified. ?Skin prepped in a sterile fashion. ?Ethyl chloride for topical local analgesia.  ?Completed without difficulty and tolerated well. ?Medication: triamcinolone acetonide 40 mg/mL suspension for injection 1 mL total and 2 mL lidocaine 1% without epinephrine utilized for needle placement anesthetic ?Advised to contact for fevers/chills, erythema, induration, drainage, or persistent bleeding. ? ?Procedure:  Injection of right greater trochanter under ultrasound guidance. ?Ultrasound guidance utilized for out of plane approach to the right greater trochanter, hypoechoic region superimposed may be consistent with bursitis ?Samsung HS60 device utilized with permanent recording / reporting. ?Verbal informed consent obtained and verified. ?Skin prepped in a sterile fashion. ?Ethyl chloride for topical local analgesia.  ?Completed without difficulty and tolerated well. ?Medication: triamcinolone acetonide 40 mg/mL suspension for injection 1 mL  total and 2 mL lidocaine 1% without epinephrine utilized for needle placement anesthetic ?Advised to contact for fevers/chills, erythema, induration, drainage, or persistent bleeding. ? ? ?Pertinent History, Exam, Impression, and Recommendations:  ? ?Problem List Items Addressed This Visit   ? ?  ? Musculoskeletal and Integument  ? Primary osteoarthritis of right hip - Primary  ?  Chronic condition that has remained symptomatic despite meloxicam titration.  Given the extent of involvement on x-rays, physical examination findings today which show persistent focality to the right hip joint, secondarily to the greater trochanteric region, we discussed additional treatment strategies.  Patient did elect to proceed with ultrasound-guided right hip cortisone injection intra-articularly.  She is to start a home-based rehab program next week, use adjunct Celebrex, and can follow-up as needed.  We can repeat cortisone in 3 months if indicated. ? ?Chronic condition, symptomatic, Rx management ? ?  ?  ? Relevant Medications  ? celecoxib (CELEBREX) 200 MG capsule  ? Other Relevant Orders  ? LIMITED JOINT SPACE STRUCTURES LOW RIGHT  ? Greater trochanteric pain syndrome of right lower extremity  ?  Focal tenderness to the right greater trochanteric region of the setting of underlying right hip osteoarthritis, this can be considered a secondary/compensatory change.  Given its recalcitrant nature despite meloxicam initial management, did discuss additional treatment strategies and she did elect to proceed with ultrasound-guided cortisone injection to the right greater trochanteric region.  She will start a home-based rehab program next week, utilize as needed Celebrex, and can follow-up as needed. ? ?  ?  ? Relevant Medications  ? celecoxib (CELEBREX) 200 MG capsule  ? Other Relevant Orders  ? [place] LIMITED JOINT SPACE STRUCTURES LOW RIGHT  ?  ? ?Orders & Medications ?  Meds ordered this encounter  ?Medications  ? celecoxib  (CELEBREX) 200 MG capsule  ?  Sig: 1 to 2 tablets by mouth daily as needed for pain.  ?  Dispense:  60 capsule  ?  Refill:  0  ? ?Orders Placed This Encounter  ?Procedures  ? [place] LIMITED JOINT SPACE STRUCTURES LOW RIGHT  ?  ? ?Return if symptoms worsen or fail to improve.  ?  ? ?Jerrol Banana, MD ? ? Primary Care Sports Medicine ?[facility] ?[facility]  ? ?

## 2022-04-14 ENCOUNTER — Ambulatory Visit
Admission: RE | Admit: 2022-04-14 | Discharge: 2022-04-14 | Disposition: A | Discharge status: Home / Self Care | Payer: 59 | Attending: Ophthalmology | Admitting: Ophthalmology

## 2022-04-14 ENCOUNTER — Ambulatory Visit: Payer: 59 | Admitting: Anesthesiology

## 2022-04-14 ENCOUNTER — Encounter: Payer: Self-pay | Admitting: Ophthalmology

## 2022-04-14 ENCOUNTER — Encounter
Admission: RE | Disposition: A | Discharge status: Home / Self Care | Payer: Self-pay | Source: Home / Self Care | Attending: Ophthalmology

## 2022-04-14 ENCOUNTER — Other Ambulatory Visit: Payer: Self-pay

## 2022-04-14 DIAGNOSIS — F17210 Nicotine dependence, cigarettes, uncomplicated: Secondary | ICD-10-CM | POA: Diagnosis not present

## 2022-04-14 DIAGNOSIS — I110 Hypertensive heart disease with heart failure: Secondary | ICD-10-CM | POA: Diagnosis not present

## 2022-04-14 DIAGNOSIS — H2512 Age-related nuclear cataract, left eye: Secondary | ICD-10-CM | POA: Insufficient documentation

## 2022-04-14 DIAGNOSIS — I509 Heart failure, unspecified: Secondary | ICD-10-CM | POA: Insufficient documentation

## 2022-04-14 HISTORY — PX: CATARACT EXTRACTION W/PHACO: SHX586

## 2022-04-14 HISTORY — DX: Unspecified osteoarthritis, unspecified site: M19.90

## 2022-04-14 SURGERY — PHACOEMULSIFICATION, CATARACT, WITH IOL INSERTION
Anesthesia: Monitor Anesthesia Care | Site: Eye | Laterality: Left

## 2022-04-14 MED ORDER — MOXIFLOXACIN HCL 0.5 % OP SOLN
OPHTHALMIC | Status: DC | PRN
  Administered 2022-04-14: 0.2 mL via OPHTHALMIC

## 2022-04-14 MED ORDER — SIGHTPATH DOSE#1 NA CHONDROIT SULF-NA HYALURON 40-17 MG/ML IO SOLN
INTRAOCULAR | Status: DC | PRN
Start: 2022-04-14 — End: 2022-04-14
  Administered 2022-04-14: 1 mL via INTRAOCULAR

## 2022-04-14 MED ORDER — SIGHTPATH DOSE#1 BSS IO SOLN
INTRAOCULAR | Status: DC | PRN
  Administered 2022-04-14: 15 mL

## 2022-04-14 MED ORDER — ARMC OPHTHALMIC DILATING DROPS
1.0000 "application " | OPHTHALMIC | Status: DC | PRN
  Administered 2022-04-14 (×3): 1 via OPHTHALMIC

## 2022-04-14 MED ORDER — MIDAZOLAM HCL 2 MG/2ML IJ SOLN
INTRAMUSCULAR | Status: DC | PRN
  Administered 2022-04-14: 1 mg via INTRAVENOUS

## 2022-04-14 MED ORDER — BRIMONIDINE TARTRATE-TIMOLOL 0.2-0.5 % OP SOLN
OPHTHALMIC | Status: DC | PRN
Start: 2022-04-14 — End: 2022-04-14
  Administered 2022-04-14: 1 [drp] via OPHTHALMIC

## 2022-04-14 MED ORDER — LACTATED RINGERS IV SOLN: INTRAVENOUS | Status: DC

## 2022-04-14 MED ORDER — SIGHTPATH DOSE#1 BSS IO SOLN
INTRAOCULAR | Status: DC | PRN
  Administered 2022-04-14: 64 mL via OPHTHALMIC

## 2022-04-14 MED ORDER — LIDOCAINE HCL (PF) 2 % IJ SOLN
INTRAMUSCULAR | Status: DC | PRN
  Administered 2022-04-14: 1 mL via INTRAMUSCULAR

## 2022-04-14 MED ORDER — FENTANYL CITRATE (PF) 100 MCG/2ML IJ SOLN
INTRAMUSCULAR | Status: DC | PRN
  Administered 2022-04-14: 50 ug via INTRAVENOUS

## 2022-04-14 MED ORDER — TETRACAINE HCL 0.5 % OP SOLN
1.0000 [drp] | OPHTHALMIC | Status: DC | PRN
  Administered 2022-04-14 (×3): 1 [drp] via OPHTHALMIC

## 2022-04-14 SURGICAL SUPPLY — 10 items
CATARACT SUITE SIGHTPATH (MISCELLANEOUS) ×2 IMPLANT
FEE CATARACT SUITE SIGHTPATH (MISCELLANEOUS) ×1 IMPLANT
GLOVE SURG ENC TEXT LTX SZ8 (GLOVE) ×2 IMPLANT
GLOVE SURG TRIUMPH 8.0 PF LTX (GLOVE) ×2 IMPLANT
LENS IOL TECNIS EYHANCE 21.0 (Intraocular Lens) ×1 IMPLANT
NDL FILTER BLUNT 18X1 1/2 (NEEDLE) ×1 IMPLANT
NEEDLE FILTER BLUNT 18X 1/2SAF (NEEDLE) ×1
NEEDLE FILTER BLUNT 18X1 1/2 (NEEDLE) ×1 IMPLANT
SYR 3ML LL SCALE MARK (SYRINGE) ×2 IMPLANT
WATER STERILE IRR 250ML POUR (IV SOLUTION) ×2 IMPLANT

## 2022-04-14 NOTE — Anesthesia Preprocedure Evaluation (Addendum)
Anesthesia Evaluation  ?Patient identified by MRN, date of birth, ID band ?Patient awake ? ? ? ?Reviewed: ?Allergy & Precautions, NPO status , Patient's Chart, lab work & pertinent test results ? ?Airway ?Mallampati: II ? ?TM Distance: >3 FB ?Neck ROM: Full ? ? ? Dental ?no notable dental hx. ? ?  ?Pulmonary ?Current Smoker,  ?  ?Pulmonary exam normal ? ? ? ? ? ? ? Cardiovascular ?hypertension, +CHF (EF 45-50%)  ?Normal cardiovascular exam+ Valvular Problems/Murmurs MR  ? ? ?  ?Neuro/Psych ?negative neurological ROS ? negative psych ROS  ? GI/Hepatic ?negative GI ROS, Neg liver ROS,   ?Endo/Other  ?negative endocrine ROS ? Renal/GU ?negative Renal ROS  ? ?  ?Musculoskeletal ? ?(+) Arthritis ,  ? Abdominal ?Normal abdominal exam  (+)   ?Peds ? Hematology ?negative hematology ROS ?(+)   ?Anesthesia Other Findings ? ? Reproductive/Obstetrics ? ?  ? ? ? ? ? ? ? ? ? ? ? ? ? ?  ?  ? ? ? ? ? ? ? ?Anesthesia Physical ?Anesthesia Plan ? ?ASA: 3 ? ?Anesthesia Plan: MAC  ? ?Post-op Pain Management: Minimal or no pain anticipated  ? ?Induction: Intravenous ? ?PONV Risk Score and Plan: 1 and TIVA, Treatment may vary due to age or medical condition and Midazolam ? ?Airway Management Planned: Nasal Cannula and Natural Airway ? ?Additional Equipment: None ? ?Intra-op Plan:  ? ?Post-operative Plan:  ? ?Informed Consent: I have reviewed the patients History and Physical, chart, labs and discussed the procedure including the risks, benefits and alternatives for the proposed anesthesia with the patient or authorized representative who has indicated his/her understanding and acceptance.  ? ? ? ?Dental advisory given ? ?Plan Discussed with: CRNA ? ?Anesthesia Plan Comments:   ? ? ? ? ? ?Anesthesia Quick Evaluation ? ?

## 2022-04-14 NOTE — H&P (Signed)
[facility]  ? ?Primary Care Physician:  [facility] ?Ophthalmologist: Dr. Druscilla Brownie ? ?Pre-Procedure History & Physical: ?HPI:  Katrina Becker is a 61 y.o. female here for cataract surgery. ?  ?Past Medical History:  ?Diagnosis Date  ? Arthritis   ? Right hip  ? CHF (congestive heart failure) (HCC)   ? Hypertension   ? Thyroid disease   ? ? ?Past Surgical History:  ?Procedure Laterality Date  ? ABDOMINAL HYSTERECTOMY    ? CESAREAN SECTION    ? ? ?Prior to Admission medications   ?Medication Sig Start Date End Date Taking? Authorizing Provider  ?celecoxib (CELEBREX) 200 MG capsule 1 to 2 tablets by mouth daily as needed for pain. 04/09/22  Yes Jerrol Banana, MD  ?hydrOXYzine (ATARAX/VISTARIL) 10 MG tablet Take 1 tablet (10 mg total) by mouth 3 (three) times daily as needed for anxiety. 08/01/21  Yes Sreenath, Sudheer B, MD  ?losartan (COZAAR) 50 MG tablet Take 1 tablet (50 mg total) by mouth daily. 08/21/21 04/14/22 Yes Delma Freeze, FNP  ?potassium chloride (KLOR-CON) 10 MEQ tablet Take 10 mEq by mouth daily. 03/04/22  Yes [provider]  ?furosemide (LASIX) 20 MG tablet Take 1 tablet (20 mg total) by mouth daily. 08/21/21 04/09/22  Delma Freeze, FNP  ?metoprolol succinate (TOPROL-XL) 25 MG 24 hr tablet Take 25 mg by mouth daily. ?Patient not taking: Reported on 04/14/2022 01/29/22 04/14/22  [provider]  ? ? ?Allergies as of 03/05/2022  ? (No Known Allergies)  ? ? ?History reviewed. No pertinent family history. ? ?Social History  ? ?Socioeconomic History  ? Marital status: Divorced  ?  Spouse name: Not on file  ? Number of children: Not on file  ? Years of education: Not on file  ? Highest education level: Not on file  ?Occupational History  ? Not on file  ?Tobacco Use  ? Smoking status: Every Day  ?  Packs/day: 1.00  ?  Years: 15.00  ?  Pack years: 15.00  ?  Types: Cigarettes  ? Smokeless tobacco: Never  ?Vaping Use  ? Vaping Use: Never used  ?Substance and Sexual Activity  ?  Alcohol use: Yes  ?  Alcohol/week: 9.0 standard drinks  ?  Types: 9 Standard drinks or equivalent per week  ? Drug use: Yes  ?  Types: Cocaine  ? Sexual activity: Yes  ?  Partners: Male  ?Other Topics Concern  ? Not on file  ?Social History Narrative  ? Not on file  ? ?Social Determinants of Health  ? ?Financial Resource Strain: Not on file  ?Food Insecurity: Not on file  ?Transportation Needs: Not on file  ?Physical Activity: Not on file  ?Stress: Not on file  ?Social Connections: Not on file  ?Intimate Partner Violence: Not on file  ? ? ?Review of Systems: ?See HPI, otherwise negative ROS ? ?Physical Exam: ?BP 96/67   Pulse 64   Temp 97.8 ?F (36.6 ?C) (Temporal)   Resp 18   Ht 5\' 5"  (1.651 m)   Wt 79.8 kg   SpO2 98%   BMI 29.29 kg/m?  ?General:   Alert, cooperative in NAD ?Head:  Normocephalic and atraumatic. ?Respiratory:  Normal work of breathing. ?Cardiovascular:  RRR ? ?Impression/Plan: ? is here for cataract surgery. ? ?Risks, benefits, limitations, and alternatives regarding cataract surgery have been reviewed with the patient.  Questions have been answered.  All parties agreeable. ? ? ?Delanna Notice, MD  04/14/2022, 9:51  AM ? ? ?

## 2022-04-14 NOTE — Transfer of Care (Signed)
Immediate Anesthesia Transfer of Care Note ? ?Patient: Katrina Becker ? ?Procedure(s) Performed: CATARACT EXTRACTION PHACO AND INTRAOCULAR LENS PLACEMENT (IOC) LEFT 5.69 00:51.3 (Left: Eye) ? ?Patient Location: PACU ? ?Anesthesia Type: MAC ? ?Level of Consciousness: awake, alert  and patient cooperative ? ?Airway and Oxygen Therapy: Patient Spontanous Breathing and Patient connected to supplemental oxygen ? ?Post-op Assessment: Post-op Vital signs reviewed, Patient's Cardiovascular Status Stable, Respiratory Function Stable, Patent Airway and No signs of Nausea or vomiting ? ?Post-op Vital Signs: Reviewed and stable ? ?Complications: No notable events documented. ? ?

## 2022-04-14 NOTE — Anesthesia Postprocedure Evaluation (Signed)
Anesthesia Post Note ? ?Patient: Katrina Becker ? ?Procedure(s) Performed: CATARACT EXTRACTION PHACO AND INTRAOCULAR LENS PLACEMENT (IOC) LEFT 5.69 00:51.3 (Left: Eye) ? ? ?  ?Patient location during evaluation: PACU ?Anesthesia Type: MAC ?Level of consciousness: awake and alert ?Pain management: pain level controlled ?Vital Signs Assessment: post-procedure vital signs reviewed and stable ?Respiratory status: spontaneous breathing and nonlabored ventilation ?Cardiovascular status: blood pressure returned to baseline ?Postop Assessment: no apparent nausea or vomiting ?Anesthetic complications: no ? ? ?No notable events documented. ? ?Ardeth Sportsman ? ? ? ? ? ?

## 2022-04-14 NOTE — Op Note (Signed)
PREOPERATIVE DIAGNOSIS:  Nuclear sclerotic cataract of the left eye. ?  ?POSTOPERATIVE DIAGNOSIS:  Nuclear sclerotic cataract of the left eye. ?  ?OPERATIVE PROCEDURE:ORPROCALL@ ?  ?SURGEON:  Birder Robson, MD. ?  ?ANESTHESIA: ? ?Anesthesiologist: Ardeth Sportsman, MD ?CRNA: Garner Nash, CRNA ? ?1.      Managed anesthesia care. ?2.     0.45ml of Shugarcaine was instilled following the paracentesis ?  ?COMPLICATIONS:  None. ?  ?TECHNIQUE:   Stop and chop ?  ?DESCRIPTION OF PROCEDURE:  The patient was examined and consented in the preoperative holding area where the aforementioned topical anesthesia was applied to the left eye and then brought back to the Operating Room where the left eye was prepped and draped in the usual sterile ophthalmic fashion and a lid speculum was placed. A paracentesis was created with the side port blade and the anterior chamber was filled with viscoelastic. A near clear corneal incision was performed with the steel keratome. A continuous curvilinear capsulorrhexis was performed with a cystotome followed by the capsulorrhexis forceps. Hydrodissection and hydrodelineation were carried out with BSS on a blunt cannula. The lens was removed in a stop and chop  technique and the remaining cortical material was removed with the irrigation-aspiration handpiece. The capsular bag was inflated with viscoelastic and the Technis [id number] lens was placed in the capsular bag without complication. The remaining viscoelastic was removed from the eye with the irrigation-aspiration handpiece. The wounds were hydrated. The anterior chamber was flushed with BSS and the eye was inflated to physiologic pressure. 0.27ml Vigamox was placed in the anterior chamber. The wounds were found to be water tight. The eye was dressed with Combigan. The patient was given protective glasses to wear throughout the day and a shield with which to sleep tonight. The patient was also given drops with which to begin a drop regimen  today and will follow-up with me in one day. ?Implant Name Type Inv. Item Serial No. Manufacturer Lot No. LRB No. Used Action  ?LENS IOL TECNIS EYHANCE 21.0 - [id number] Intraocular Lens LENS IOL TECNIS EYHANCE 21.0 [id number] [employer]  Left 1 Implanted  ?  ?Procedure(s): ?CATARACT EXTRACTION PHACO AND INTRAOCULAR LENS PLACEMENT (IOC) LEFT 5.69 00:51.3 (Left) ? ?Electronically signed: Birder Robson 04/14/2022 10:19 AM ? ?

## 2022-04-15 ENCOUNTER — Encounter: Payer: Self-pay | Admitting: Ophthalmology

## 2022-04-17 ENCOUNTER — Other Ambulatory Visit: Payer: Self-pay | Admitting: Family Medicine

## 2022-04-17 DIAGNOSIS — M1611 Unilateral primary osteoarthritis, right hip: Secondary | ICD-10-CM

## 2022-04-17 NOTE — Telephone Encounter (Signed)
Discontinued 04/09/22. ?Requested Prescriptions  ?Pending Prescriptions Disp Refills  ?? meloxicam (MOBIC) 7.5 MG tablet [Pharmacy Med Name: MELOXICAM 7.5 MG TABLET] 30 tablet 0  ?  Sig: TAKE ONE TO TWO TABLETS BY MOUTH EVERY DAY  ?  ? Analgesics:  COX2 Inhibitors Failed - 04/17/2022 12:09 PM  ?  ?  Failed - Manual Review: Labs are only required if the patient has taken medication for more than 8 weeks.  ?  ?  Failed - AST in normal range and within 360 days  ?  No results found for: POCAST, AST   ?  ?  Failed - ALT in normal range and within 360 days  ?  No results found for: ALT, LABALT, POCALT   ?  ?  Passed - HGB in normal range and within 360 days  ?  Hemoglobin  ?Date Value Ref Range Status  ?07/31/2021 13.3 12.0 - 15.0 g/dL Final  ?   ?  ?  Passed - Cr in normal range and within 360 days  ?  Creatinine, Ser  ?Date Value Ref Range Status  ?07/31/2021 0.81 0.44 - 1.00 mg/dL Final  ?   ?  ?  Passed - HCT in normal range and within 360 days  ?  HCT  ?Date Value Ref Range Status  ?07/31/2021 41.3 36.0 - 46.0 % Final  ?   ?  ?  Passed - eGFR is 30 or above and within 360 days  ?  GFR calc Af Amer  ?Date Value Ref Range Status  ?05/27/2019 >60 >60 mL/min Final  ? ?GFR, Estimated  ?Date Value Ref Range Status  ?07/31/2021 >60 >60 mL/min Final  ?  Comment:  ?  (NOTE) ?Calculated using the CKD-EPI Creatinine Equation (2021) ?  ?   ?  ?  Passed - Patient is not pregnant  ?  ?  Passed - Valid encounter within last 12 months  ?  Recent Outpatient Visits   ?      ? 1 week ago Primary osteoarthritis of right hip  ? [facility] Montel Culver, MD  ? 3 weeks ago Primary osteoarthritis of right hip  ? [facility] Montel Culver, MD  ? 1 month ago Chronic right hip pain  ? [facility] Montel Culver, MD  ?  ?  ? ?  ?  ?  ? ? ?

## 2022-05-01 ENCOUNTER — Telehealth: Payer: Self-pay | Admitting: Family Medicine

## 2022-05-01 ENCOUNTER — Other Ambulatory Visit: Payer: Self-pay | Admitting: Family Medicine

## 2022-05-01 DIAGNOSIS — M1611 Unilateral primary osteoarthritis, right hip: Secondary | ICD-10-CM

## 2022-05-01 DIAGNOSIS — M25551 Pain in right hip: Secondary | ICD-10-CM

## 2022-05-01 MED ORDER — MELOXICAM 7.5 MG PO TABS: 7.5000 mg | ORAL_TABLET | Freq: Two times a day (BID) | ORAL | 0 refills | Status: DC | PRN

## 2022-05-01 NOTE — Telephone Encounter (Signed)
Copied from [facility] [id number]. Topic: General - Other ?>> May 01, 2022 12:56 PM Leward Quan A wrote: ?Reason for CRM: Patient called in to inform Dr Zigmund Daniel that she is in lots of pain and the celecoxib (CELEBREX) 200 MG capsule is not working and she want to go back on the meloxicam (MOBIC) 15 MG tablet or get something else. Please advise and call patient at Ph# [phone] ?

## 2022-05-04 NOTE — Telephone Encounter (Signed)
Called pt informed her meloxicam was sent. Told pt do not take it with celebrex or any other NSAIDS. She verbalized understanding. ? ?KP ?

## 2022-05-07 ENCOUNTER — Encounter: Payer: Self-pay | Admitting: Ophthalmology

## 2022-05-12 ENCOUNTER — Ambulatory Visit: Payer: Self-pay | Admitting: *Deleted

## 2022-05-12 NOTE — Telephone Encounter (Signed)
  Chief Complaint: hip pain requesting appt  Symptoms: right hip pain worsening than left . Dull aching and sometimes sharp pain since getting injection. Has varicose veins . Pain shooting down right leg. Bilateral arms get numb at times after sleeping. Skin very dry and reports peeling since taking celebrex. 05/01/22 celebrex discontinued  or told patient not to take with meloxicam. but patient reports taking celebrex because it worked better than meloxicam. Did not report taking together . Frequency: since infections Pertinent Negatives: Patient denies fever inability to walk,  Disposition: [] ED /[] Urgent Care (no appt availability in office) / [] Appointment(In office/virtual)/ []  [facility]/ [] Home Care/ [] Refused Recommended Disposition /[] [facility]/ [x]  Follow-up with PCP Additional Notes:    [facility] unable to schedule appt for sports medicine visit. Patient would like a call back to schedule appt. Recommended to stop celebrex until Dr. Tinnie Gens. Patient verbalized understanding. Awaiting a call back.  Reason for Disposition  [1] MODERATE pain (e.g., interferes with normal activities, limping) AND [2] present > 3 days  Answer Assessment - Initial Assessment Questions 1. LOCATION and RADIATION: "Where is the pain located?"      Bilateral hips right hip worse than left 2. QUALITY: "What does the pain feel like?"  (e.g., sharp, dull, aching, burning)     Dull aching and sometimes can be sharp, varies  3. SEVERITY: "How bad is the pain?" "What does it keep you from doing?"   (Scale 1-10; or mild, moderate, severe)   -  MILD (1-3): doesn't interfere with normal activities    -  MODERATE (4-7): interferes with normal activities (e.g., work or school) or awakens from sleep, limping    -  SEVERE (8-10): excruciating pain, unable to do any normal activities, unable to walk     Pain with walking and right leg feels like "it will give" out  4. ONSET: "When did the pain start?"  "Does it come and go, or is it there all the time?"     Worsening since last injections  5. WORK OR EXERCISE: "Has there been any recent work or exercise that involved this part of the body?"      no 6. CAUSE: "What do you think is causing the hip pain?"      Not sure  7. AGGRAVATING FACTORS: "What makes the hip pain worse?" (e.g., walking, climbing stairs, running)     Walking  8. OTHER SYMPTOMS: "Do you have any other symptoms?" (e.g., back pain, pain shooting down leg,  fever, rash)     Pain shooting down leg, has varicose veins  Protocols used: Hip Pain-A-AH

## 2022-05-12 NOTE — Telephone Encounter (Signed)
Appt scheduled for 05/15/22.  KP

## 2022-05-13 NOTE — Discharge Instructions (Signed)

## 2022-05-15 ENCOUNTER — Ambulatory Visit: Payer: 59 | Admitting: Family Medicine

## 2022-05-16 ENCOUNTER — Other Ambulatory Visit: Payer: Self-pay | Admitting: Family Medicine

## 2022-05-19 ENCOUNTER — Encounter: Payer: Self-pay | Admitting: Ophthalmology

## 2022-05-19 ENCOUNTER — Other Ambulatory Visit: Payer: Self-pay

## 2022-05-19 ENCOUNTER — Encounter
Admission: RE | Disposition: A | Discharge status: Home / Self Care | Payer: Self-pay | Source: Home / Self Care | Attending: Ophthalmology

## 2022-05-19 ENCOUNTER — Ambulatory Visit: Payer: BLUE CROSS/BLUE SHIELD | Admitting: Anesthesiology

## 2022-05-19 ENCOUNTER — Ambulatory Visit
Admission: RE | Admit: 2022-05-19 | Discharge: 2022-05-19 | Disposition: A | Discharge status: Home / Self Care | Payer: BLUE CROSS/BLUE SHIELD | Attending: Ophthalmology | Admitting: Ophthalmology

## 2022-05-19 DIAGNOSIS — I509 Heart failure, unspecified: Secondary | ICD-10-CM | POA: Diagnosis not present

## 2022-05-19 DIAGNOSIS — H2511 Age-related nuclear cataract, right eye: Secondary | ICD-10-CM | POA: Diagnosis present

## 2022-05-19 DIAGNOSIS — I110 Hypertensive heart disease with heart failure: Secondary | ICD-10-CM | POA: Diagnosis not present

## 2022-05-19 DIAGNOSIS — F17210 Nicotine dependence, cigarettes, uncomplicated: Secondary | ICD-10-CM | POA: Diagnosis not present

## 2022-05-19 HISTORY — PX: CATARACT EXTRACTION W/PHACO: SHX586

## 2022-05-19 SURGERY — PHACOEMULSIFICATION, CATARACT, WITH IOL INSERTION
Anesthesia: Monitor Anesthesia Care | Site: Eye | Laterality: Right

## 2022-05-19 MED ORDER — FENTANYL CITRATE (PF) 100 MCG/2ML IJ SOLN
INTRAMUSCULAR | Status: DC | PRN
  Administered 2022-05-19: 100 ug via INTRAVENOUS

## 2022-05-19 MED ORDER — BRIMONIDINE TARTRATE-TIMOLOL 0.2-0.5 % OP SOLN
OPHTHALMIC | Status: DC | PRN
  Administered 2022-05-19: 1 [drp] via OPHTHALMIC

## 2022-05-19 MED ORDER — TETRACAINE HCL 0.5 % OP SOLN
1.0000 [drp] | OPHTHALMIC | Status: DC | PRN
  Administered 2022-05-19 (×3): 1 [drp] via OPHTHALMIC

## 2022-05-19 MED ORDER — EPINEPHRINE PF 1 MG/ML IJ SOLN
INTRAMUSCULAR | Status: DC | PRN
  Administered 2022-05-19: 55 mL via OPHTHALMIC

## 2022-05-19 MED ORDER — SIGHTPATH DOSE#1 BSS IO SOLN
INTRAOCULAR | Status: DC | PRN
Start: 2022-05-19 — End: 2022-05-19
  Administered 2022-05-19: 15 mL via INTRAOCULAR

## 2022-05-19 MED ORDER — ARMC OPHTHALMIC DILATING DROPS
1.0000 "application " | OPHTHALMIC | Status: DC | PRN
  Administered 2022-05-19 (×3): 1 via OPHTHALMIC

## 2022-05-19 MED ORDER — SIGHTPATH DOSE#1 NA CHONDROIT SULF-NA HYALURON 40-17 MG/ML IO SOLN
INTRAOCULAR | Status: DC | PRN
  Administered 2022-05-19: 1 mL via INTRAOCULAR

## 2022-05-19 MED ORDER — MOXIFLOXACIN HCL 0.5 % OP SOLN
OPHTHALMIC | Status: DC | PRN
Start: 2022-05-19 — End: 2022-05-19
  Administered 2022-05-19: 0.2 mL via OPHTHALMIC

## 2022-05-19 MED ORDER — MIDAZOLAM HCL 2 MG/2ML IJ SOLN
INTRAMUSCULAR | Status: DC | PRN
  Administered 2022-05-19: 2 mg via INTRAVENOUS

## 2022-05-19 MED ORDER — SIGHTPATH DOSE#1 BSS IO SOLN
INTRAOCULAR | Status: DC | PRN
Start: 2022-05-19 — End: 2022-05-19
  Administered 2022-05-19: 2 mL

## 2022-05-19 SURGICAL SUPPLY — 12 items
CANNULA ANT/CHMB 27G (MISCELLANEOUS) IMPLANT
CANNULA ANT/CHMB 27GA (MISCELLANEOUS) IMPLANT
CATARACT SUITE SIGHTPATH (MISCELLANEOUS) ×2 IMPLANT
FEE CATARACT SUITE SIGHTPATH (MISCELLANEOUS) ×1 IMPLANT
GLOVE SURG ENC TEXT LTX SZ8 (GLOVE) ×2 IMPLANT
GLOVE SURG TRIUMPH 8.0 PF LTX (GLOVE) ×2 IMPLANT
LENS IOL TECNIS EYHANCE 21.5 (Intraocular Lens) ×1 IMPLANT
NDL FILTER BLUNT 18X1 1/2 (NEEDLE) ×1 IMPLANT
NEEDLE FILTER BLUNT 18X 1/2SAF (NEEDLE) ×1
NEEDLE FILTER BLUNT 18X1 1/2 (NEEDLE) ×1 IMPLANT
SYR 3ML LL SCALE MARK (SYRINGE) ×2 IMPLANT
WATER STERILE IRR 250ML POUR (IV SOLUTION) ×2 IMPLANT

## 2022-05-19 NOTE — Op Note (Signed)
PREOPERATIVE DIAGNOSIS:  Nuclear sclerotic cataract of the right eye.   POSTOPERATIVE DIAGNOSIS:  Cataract   OPERATIVE PROCEDURE:ORPROCALL@   SURGEON:  Galen Manila, MD.   ANESTHESIA:  Anesthesiologist: Jola Babinski, MD CRNA: Michaele Offer, CRNA  1.      Managed anesthesia care. 2.      0.55ml of Shugarcaine was instilled in the eye following the paracentesis.   COMPLICATIONS:  None.   TECHNIQUE:   Stop and chop   DESCRIPTION OF PROCEDURE:  The patient was examined and consented in the preoperative holding area where the aforementioned topical anesthesia was applied to the right eye and then brought back to the Operating Room where the right eye was prepped and draped in the usual sterile ophthalmic fashion and a lid speculum was placed. A paracentesis was created with the side port blade and the anterior chamber was filled with viscoelastic. A near clear corneal incision was performed with the steel keratome. A continuous curvilinear capsulorrhexis was performed with a cystotome followed by the capsulorrhexis forceps. Hydrodissection and hydrodelineation were carried out with BSS on a blunt cannula. The lens was removed in a stop and chop  technique and the remaining cortical material was removed with the irrigation-aspiration handpiece. The capsular bag was inflated with viscoelastic and the Technis [id number]  lens was placed in the capsular bag without complication. The remaining viscoelastic was removed from the eye with the irrigation-aspiration handpiece. The wounds were hydrated. The anterior chamber was flushed with BSS and the eye was inflated to physiologic pressure. 0.63ml of Vigamox was placed in the anterior chamber. The wounds were found to be water tight. The eye was dressed with Combigan. The patient was given protective glasses to wear throughout the day and a shield with which to sleep tonight. The patient was also given drops with which to begin a drop regimen today and will  follow-up with me in one day. Implant Name Type Inv. Item Serial No. Manufacturer Lot No. LRB No. Used Action  LENS IOL TECNIS EYHANCE 21.5 - [id number] Intraocular Lens LENS IOL TECNIS EYHANCE 21.5 [id number] [employer]  Right 1 Implanted   Procedure(s): CATARACT EXTRACTION PHACO AND INTRAOCULAR LENS PLACEMENT (IOC) RIGHT 4.47 00:32.7 (Right)  Electronically signed: Galen Manila 05/19/2022 10:04 AM

## 2022-05-19 NOTE — Transfer of Care (Signed)
Immediate Anesthesia Transfer of Care Note  Patient: Katrina Becker  Procedure(s) Performed: CATARACT EXTRACTION PHACO AND INTRAOCULAR LENS PLACEMENT (IOC) RIGHT 4.47 00:32.7 (Right: Eye)  Patient Location: PACU  Anesthesia Type: MAC  Level of Consciousness: awake, alert  and patient cooperative  Airway and Oxygen Therapy: Patient Spontanous Breathing and Patient connected to supplemental oxygen  Post-op Assessment: Post-op Vital signs reviewed, Patient's Cardiovascular Status Stable, Respiratory Function Stable, Patent Airway and No signs of Nausea or vomiting  Post-op Vital Signs: Reviewed and stable  Complications: No notable events documented.

## 2022-05-19 NOTE — Telephone Encounter (Signed)
Requested medication (s) are due for refill today - unknown  Requested medication (s) are on the active medication list -yes  Future visit scheduled -no  Last refill: listed as historical medication  Notes to clinic: patient has been seen by specialist- not sure why this is sent to office- not PCP  Requested Prescriptions  Pending Prescriptions Disp Refills   celecoxib (CELEBREX) 200 MG capsule [Pharmacy Med Name: CELECOXIB 200 MG CAPSULE] 60 capsule     Sig: TAKE ONE TO TWO CAPSULES BY MOUTH EVERY DAY AS NEEDED FOR PAIN     Analgesics:  COX2 Inhibitors Failed - 05/16/2022  6:51 AM      Failed - Manual Review: Labs are only required if the patient has taken medication for more than 8 weeks.      Failed - AST in normal range and within 360 days    No results found for: POCAST, AST       Failed - ALT in normal range and within 360 days    No results found for: ALT, LABALT, POCALT       Passed - HGB in normal range and within 360 days    Hemoglobin  Date Value Ref Range Status  07/31/2021 13.3 12.0 - 15.0 g/dL Final         Passed - Cr in normal range and within 360 days    Creatinine, Ser  Date Value Ref Range Status  07/31/2021 0.81 0.44 - 1.00 mg/dL Final         Passed - HCT in normal range and within 360 days    HCT  Date Value Ref Range Status  07/31/2021 41.3 36.0 - 46.0 % Final         Passed - eGFR is 30 or above and within 360 days    GFR calc Af Amer  Date Value Ref Range Status  05/27/2019 >60 >60 mL/min Final   GFR, Estimated  Date Value Ref Range Status  07/31/2021 >60 >60 mL/min Final    Comment:    (NOTE) Calculated using the CKD-EPI Creatinine Equation (2021)          Passed - Patient is not pregnant      Passed - Valid encounter within last 12 months    Recent Outpatient Visits           1 month ago Primary osteoarthritis of right hip   [facility] Montel Culver, MD   1 month ago Primary osteoarthritis of right hip   [facility] Montel Culver, MD   3 months ago Chronic right hip pain   [facility] Montel Culver, MD                  Requested Prescriptions  Pending Prescriptions Disp Refills   celecoxib (CELEBREX) 200 MG capsule [Pharmacy Med Name: CELECOXIB 200 MG CAPSULE] 60 capsule     Sig: TAKE ONE TO TWO CAPSULES BY MOUTH EVERY DAY AS NEEDED FOR PAIN     Analgesics:  COX2 Inhibitors Failed - 05/16/2022  6:51 AM      Failed - Manual Review: Labs are only required if the patient has taken medication for more than 8 weeks.      Failed - AST in normal range and within 360 days    No results found for: POCAST, AST       Failed - ALT in normal range and within 360 days    No results found  for: ALT, LABALT, POCALT       Passed - HGB in normal range and within 360 days    Hemoglobin  Date Value Ref Range Status  07/31/2021 13.3 12.0 - 15.0 g/dL Final         Passed - Cr in normal range and within 360 days    Creatinine, Ser  Date Value Ref Range Status  07/31/2021 0.81 0.44 - 1.00 mg/dL Final         Passed - HCT in normal range and within 360 days    HCT  Date Value Ref Range Status  07/31/2021 41.3 36.0 - 46.0 % Final         Passed - eGFR is 30 or above and within 360 days    GFR calc Af Amer  Date Value Ref Range Status  05/27/2019 >60 >60 mL/min Final   GFR, Estimated  Date Value Ref Range Status  07/31/2021 >60 >60 mL/min Final    Comment:    (NOTE) Calculated using the CKD-EPI Creatinine Equation (2021)          Passed - Patient is not pregnant      Passed - Valid encounter within last 12 months    Recent Outpatient Visits           1 month ago Primary osteoarthritis of right hip   [facility] Montel Culver, MD   1 month ago Primary osteoarthritis of right hip   [facility] Montel Culver, MD   3 months ago Chronic right hip pain   [facility] Montel Culver, MD

## 2022-05-19 NOTE — Anesthesia Postprocedure Evaluation (Signed)
Anesthesia Post Note  Patient: Katrina Becker  Procedure(s) Performed: CATARACT EXTRACTION PHACO AND INTRAOCULAR LENS PLACEMENT (IOC) RIGHT 4.47 00:32.7 (Right: Eye)     Patient location during evaluation: PACU Anesthesia Type: MAC Level of consciousness: awake Pain management: pain level controlled Vital Signs Assessment: post-procedure vital signs reviewed and stable Respiratory status: respiratory function stable Cardiovascular status: stable Postop Assessment: no apparent nausea or vomiting Anesthetic complications: no   No notable events documented.  Veda Canning

## 2022-05-19 NOTE — Anesthesia Preprocedure Evaluation (Signed)
Anesthesia Evaluation  Patient identified by MRN, date of birth, ID band Patient awake    Reviewed: Allergy & Precautions, NPO status   Airway Mallampati: II  TM Distance: >3 FB Neck ROM: Full    Dental no notable dental hx.    Pulmonary Current Smoker and Patient abstained from smoking.,    Pulmonary exam normal        Cardiovascular hypertension, +CHF (EF 45-50%)  Normal cardiovascular exam+ Valvular Problems/Murmurs MR      Neuro/Psych negative neurological ROS     GI/Hepatic negative GI ROS,   Endo/Other    Renal/GU      Musculoskeletal  (+) Arthritis ,   Abdominal   Peds  Hematology   Anesthesia Other Findings   Reproductive/Obstetrics                             Anesthesia Physical  Anesthesia Plan  ASA: 3  Anesthesia Plan: MAC   Post-op Pain Management: Minimal or no pain anticipated   Induction: Intravenous  PONV Risk Score and Plan: 1 and TIVA, Treatment may vary due to age or medical condition and Midazolam  Airway Management Planned: Nasal Cannula and Natural Airway  Additional Equipment: None  Intra-op Plan:   Post-operative Plan:   Informed Consent: I have reviewed the patients History and Physical, chart, labs and discussed the procedure including the risks, benefits and alternatives for the proposed anesthesia with the patient or authorized representative who has indicated his/her understanding and acceptance.     Dental advisory given  Plan Discussed with: CRNA  Anesthesia Plan Comments:         Anesthesia Quick Evaluation

## 2022-05-19 NOTE — H&P (Signed)
[facility]   Primary Care Physician:  [facility] Ophthalmologist: Dr. Druscilla Brownie  Pre-Procedure History & Physical: HPI:  Katrina Becker is a 61 y.o. female here for cataract surgery.   Past Medical History:  Diagnosis Date   Arthritis    Right hip   CHF (congestive heart failure) (HCC)    Hypertension    Thyroid disease     Past Surgical History:  Procedure Laterality Date   ABDOMINAL HYSTERECTOMY     CATARACT EXTRACTION W/PHACO Left 04/14/2022   Procedure: CATARACT EXTRACTION PHACO AND INTRAOCULAR LENS PLACEMENT (IOC) LEFT 5.69 00:51.3;  Surgeon: Galen Manila, MD;  Location: [facility];  Service: Ophthalmology;  Laterality: Left;   CESAREAN SECTION      Prior to Admission medications   Medication Sig Start Date End Date Taking? Authorizing Provider  celecoxib (CELEBREX) 200 MG capsule Take 200 mg by mouth daily as needed.   Yes [provider]  furosemide (LASIX) 20 MG tablet Take 1 tablet (20 mg total) by mouth daily. 08/21/21 05/19/22 Yes Clarisa Kindred A, FNP  hydrOXYzine (ATARAX/VISTARIL) 10 MG tablet Take 1 tablet (10 mg total) by mouth 3 (three) times daily as needed for anxiety. 08/01/21  Yes Sreenath, Sudheer B, MD  losartan (COZAAR) 50 MG tablet Take 1 tablet (50 mg total) by mouth daily. 08/21/21 05/19/22 Yes Clarisa Kindred A, FNP  metoprolol succinate (TOPROL-XL) 25 MG 24 hr tablet Take 25 mg by mouth daily. 01/29/22 05/19/22 Yes [provider]  potassium chloride (KLOR-CON) 10 MEQ tablet Take 10 mEq by mouth daily. 03/04/22  Yes [provider]  meloxicam (MOBIC) 7.5 MG tablet Take 1 tablet (7.5 mg total) by mouth 2 (two) times daily as needed for pain. Patient not taking: Reported on 05/07/2022 05/01/22   Jerrol Banana, MD    Allergies as of 03/05/2022   (No Known Allergies)    History reviewed. No pertinent family history.  Social History   Socioeconomic History   Marital status: Divorced    Spouse name: Not  on file   Number of children: Not on file   Years of education: Not on file   Highest education level: Not on file  Occupational History   Not on file  Tobacco Use   Smoking status: Every Day    Packs/day: 1.00    Years: 15.00    Pack years: 15.00    Types: Cigarettes   Smokeless tobacco: Never  Vaping Use   Vaping Use: Never used  Substance and Sexual Activity   Alcohol use: Yes    Alcohol/week: 9.0 standard drinks    Types: 9 Standard drinks or equivalent per week   Drug use: Not Currently    Types: Cocaine    Comment: 05/07/22 pt reports none for over 5 yrs   Sexual activity: Yes    Partners: Male  Other Topics Concern   Not on file  Social History Narrative   Not on file   Social Determinants of Health   Financial Resource Strain: Not on file  Food Insecurity: Not on file  Transportation Needs: Not on file  Physical Activity: Not on file  Stress: Not on file  Social Connections: Not on file  Intimate Partner Violence: Not on file    Review of Systems: See HPI, otherwise negative ROS  Physical Exam: BP 102/73   Pulse 63   Temp (!) 97.3 F (36.3 C)   Ht 5\' 5"  (1.651 m)   Wt 78.5 kg  SpO2 99%   BMI 28.81 kg/m  General:   Alert, cooperative in NAD Head:  Normocephalic and atraumatic. Respiratory:  Normal work of breathing. Cardiovascular:  RRR  Impression/Plan: Katrina Becker is here for cataract surgery.  Risks, benefits, limitations, and alternatives regarding cataract surgery have been reviewed with the patient.  Questions have been answered.  All parties agreeable.   Galen Manila, MD  05/19/2022, 9:40 AM

## 2022-05-20 ENCOUNTER — Encounter: Payer: Self-pay | Admitting: Ophthalmology

## 2022-06-12 ENCOUNTER — Other Ambulatory Visit: Payer: Self-pay | Admitting: Family Medicine

## 2022-06-23 ENCOUNTER — Other Ambulatory Visit: Payer: Self-pay | Admitting: Family Medicine

## 2022-06-24 NOTE — Telephone Encounter (Signed)
Requested medication (s) are due for refill today: yes  Requested medication (s) are on the active medication list: yes  Last refill:  05/07/22  Future visit scheduled: yes  Notes to clinic:  Unable to refill per protocol, last refill by historical provider.      Requested Prescriptions  Pending Prescriptions Disp Refills   celecoxib (CELEBREX) 200 MG capsule [Pharmacy Med Name: CELECOXIB 200 MG CAPSULE] 60 capsule     Sig: TAKE ONE TO TWO CAPSULES BY MOUTH EVERY DAY AS NEEDED FOR PAIN     Analgesics:  COX2 Inhibitors Failed - 06/23/2022 12:07 PM      Failed - Manual Review: Labs are only required if the patient has taken medication for more than 8 weeks.      Failed - AST in normal range and within 360 days    No results found for: "POCAST", "AST"       Failed - ALT in normal range and within 360 days    No results found for: "ALT", "LABALT", "POCALT"       Passed - HGB in normal range and within 360 days    Hemoglobin  Date Value Ref Range Status  07/31/2021 13.3 12.0 - 15.0 g/dL Final         Passed - Cr in normal range and within 360 days    Creatinine, Ser  Date Value Ref Range Status  07/31/2021 0.81 0.44 - 1.00 mg/dL Final         Passed - HCT in normal range and within 360 days    HCT  Date Value Ref Range Status  07/31/2021 41.3 36.0 - 46.0 % Final         Passed - eGFR is 30 or above and within 360 days    GFR calc Af Amer  Date Value Ref Range Status  05/27/2019 >60 >60 mL/min Final   GFR, Estimated  Date Value Ref Range Status  07/31/2021 >60 >60 mL/min Final    Comment:    (NOTE) Calculated using the CKD-EPI Creatinine Equation (2021)          Passed - Patient is not pregnant      Passed - Valid encounter within last 12 months    Recent Outpatient Visits           2 months ago Primary osteoarthritis of right hip   [facility] Montel Culver, MD   3 months ago Primary osteoarthritis of right hip   [facility]  Montel Culver, MD   4 months ago Chronic right hip pain   [facility] Montel Culver, MD

## 2022-06-26 ENCOUNTER — Encounter: Payer: Self-pay | Admitting: Family Medicine

## 2022-07-01 ENCOUNTER — Inpatient Hospital Stay (INDEPENDENT_AMBULATORY_CARE_PROVIDER_SITE_OTHER): Payer: BLUE CROSS/BLUE SHIELD | Admitting: Radiology

## 2022-07-01 ENCOUNTER — Ambulatory Visit
Admission: RE | Admit: 2022-07-01 | Discharge: 2022-07-01 | Disposition: A | Discharge status: Home / Self Care | Payer: BLUE CROSS/BLUE SHIELD | Attending: Family Medicine | Admitting: Family Medicine

## 2022-07-01 ENCOUNTER — Ambulatory Visit
Admission: RE | Admit: 2022-07-01 | Discharge: 2022-07-01 | Disposition: A | Discharge status: Home / Self Care | Payer: BLUE CROSS/BLUE SHIELD | Source: Ambulatory Visit | Attending: Family Medicine | Admitting: Family Medicine

## 2022-07-01 ENCOUNTER — Ambulatory Visit (INDEPENDENT_AMBULATORY_CARE_PROVIDER_SITE_OTHER): Payer: Self-pay | Admitting: Family Medicine

## 2022-07-01 ENCOUNTER — Encounter: Payer: Self-pay | Admitting: Family Medicine

## 2022-07-01 VITALS — BP 120/80 | HR 72 | Ht 65.0 in | Wt 177.0 lb

## 2022-07-01 DIAGNOSIS — M25561 Pain in right knee: Secondary | ICD-10-CM | POA: Insufficient documentation

## 2022-07-01 DIAGNOSIS — M1611 Unilateral primary osteoarthritis, right hip: Secondary | ICD-10-CM

## 2022-07-01 MED ORDER — DICLOFENAC SODIUM 50 MG PO TBEC: 50.0000 mg | DELAYED_RELEASE_TABLET | Freq: Two times a day (BID) | ORAL | 0 refills | Status: AC | PRN

## 2022-07-01 MED ORDER — TRIAMCINOLONE ACETONIDE 40 MG/ML IJ SUSP
40.0000 mg | Freq: Once | INTRAMUSCULAR | Status: AC
  Administered 2022-07-01: 40 mg via INTRAMUSCULAR

## 2022-07-01 NOTE — Assessment & Plan Note (Signed)
Patient presents for follow-up to chronic right hip pain in the setting of underlying osteoarthritis, of note she had received both right hip intra-articular and right greater trochanteric region cortisone injections roughly 3 months prior.  She had been dosing Celebrex with limited response, pain does radiate into the right anterior thigh and she has begun noting ipsilateral right knee pain.  Examination shows minimally tender right greater trochanter, nontender gluteus region, equivocal FABER, positive FADIR that recreates her symptoms, negative straight leg raise.  We discussed various treatment strategies and given her recent exposure to Celebrex and findings today she did elect to proceed with ultrasound-guided right intra-articular hip joint injection with cortisone.  She tolerated this well, post care reviewed, and I have strongly encouraged the patient to start a home exercise program to condition the right hip.  [facility] hip conditioning program materials provided.  She can follow-up on as-needed basis, repeat cortisone can be performed in 3 months if indicated.

## 2022-07-01 NOTE — Progress Notes (Signed)
Primary Care / Sports Medicine Office Visit  Patient Information:  Patient ID: DEMIAH GULLICKSON, female DOB: 07-24-1961 Age: 61 y.o. MRN: [id number]   Katrina Becker is a pleasant 61 y.o. female presenting with the following:  Chief Complaint  Patient presents with   Primary osteoarthritis of right hip    Pt states it has been in pain for 2 weeks now, from hip to ankle. States last shot lasted about 6-8 weeks    Vitals:   07/01/22 0800  BP: 120/80  Pulse: 72  SpO2: 98%   Vitals:   07/01/22 0800  Weight: 177 lb (80.3 kg)  Height: 5\' 5"  (1.651 m)   Body mass index is 29.45 kg/m.  No results found.   Independent interpretation of notes and tests performed by another provider:   None  Procedures performed:   Procedure:  Injection of right hip joint under ultrasound guidance. Ultrasound guidance utilized for in-plane approach to the right hip joint, no effusion noted Samsung HS60 device utilized with permanent recording / reporting. Verbal informed consent obtained and verified. Skin prepped in a sterile fashion. Ethyl chloride for topical local analgesia.  Completed without difficulty and tolerated well. Medication: triamcinolone acetonide 40 mg/mL suspension for injection 1 mL total and 2 mL lidocaine 1% without epinephrine utilized for needle placement anesthetic Advised to contact for fevers/chills, erythema, induration, drainage, or persistent bleeding.   Pertinent History, Exam, Impression, and Recommendations:   Problem List Items Addressed This Visit       Musculoskeletal and Integument   Primary osteoarthritis of right hip - Primary    Patient presents for follow-up to chronic right hip pain in the setting of underlying osteoarthritis, of note she had received both right hip intra-articular and right greater trochanteric region cortisone injections roughly 3 months prior.  She had been dosing Celebrex with limited response, pain does radiate into the  right anterior thigh and she has begun noting ipsilateral right knee pain.  Examination shows minimally tender right greater trochanter, nontender gluteus region, equivocal FABER, positive FADIR that recreates her symptoms, negative straight leg raise.  We discussed various treatment strategies and given her recent exposure to Celebrex and findings today she did elect to proceed with ultrasound-guided right intra-articular hip joint injection with cortisone.  She tolerated this well, post care reviewed, and I have strongly encouraged the patient to start a home exercise program to condition the right hip.  [facility] hip conditioning program materials provided.  She can follow-up on as-needed basis, repeat cortisone can be performed in 3 months if indicated.      Relevant Medications   diclofenac (VOLTAREN) 50 MG EC tablet   Other Relevant Orders   LIMITED JOINT SPACE STRUCTURES LOW RIGHT     Other   Acute pain of right knee    Patient describes few weeks history of diffuse right knee pain, she has noted this progressively worsening in the setting of right hip severe pain.  Examination shows full range of motion with mild pain during maximal flexion to 130 degrees, no crepitus, nontender at the joint lines, minimally tender at the right medial patellar facet.  McMurray's negative, no laxity with anterior/posterior varus/valgus stressing.  Findings are most consistent with compensatory pain from ipsilateral right hip osteoarthritis causing altered gait/biomechanics.  We will proceed with x-rays as she describes a distant history of chronicity, will prescribe diclofenac to be utilized on a as needed basis and if symptoms persist at the 2-week mark  or beyond, return for further evaluation.      Relevant Medications   diclofenac (VOLTAREN) 50 MG EC tablet   Other Relevant Orders   DG Knee Complete 4 Views Right     Orders & Medications Meds ordered this encounter  Medications   diclofenac  (VOLTAREN) 50 MG EC tablet    Sig: Take 1 tablet (50 mg total) by mouth 2 (two) times daily as needed.    Dispense:  30 tablet    Refill:  0   triamcinolone acetonide (KENALOG-40) injection 40 mg   Orders Placed This Encounter  Procedures   [place] LIMITED JOINT SPACE STRUCTURES LOW RIGHT   DG Knee Complete 4 Views Right     Return if symptoms worsen or fail to improve.     Jerrol Banana, MD   Primary Care Sports Medicine [facility]

## 2022-07-01 NOTE — Assessment & Plan Note (Signed)
Patient describes few weeks history of diffuse right knee pain, she has noted this progressively worsening in the setting of right hip severe pain.  Examination shows full range of motion with mild pain during maximal flexion to 130 degrees, no crepitus, nontender at the joint lines, minimally tender at the right medial patellar facet.  McMurray's negative, no laxity with anterior/posterior varus/valgus stressing.  Findings are most consistent with compensatory pain from ipsilateral right hip osteoarthritis causing altered gait/biomechanics.  We will proceed with x-rays as she describes a distant history of chronicity, will prescribe diclofenac to be utilized on a as needed basis and if symptoms persist at the 2-week mark or beyond, return for further evaluation.

## 2022-07-01 NOTE — Patient Instructions (Signed)
You have just been given a cortisone injection to reduce pain and inflammation. After the injection you may notice immediate relief of pain as a result of the Lidocaine. It is important to rest the area of the injection for 24 to 48 hours after the injection. There is a possibility of some temporary increased discomfort and swelling for up to 72 hours until the cortisone begins to work. If you do have pain, simply rest the joint and use ice. If you can tolerate over the counter medications, you can try Tylenol for added relief per package instructions. - Obtain knee x-ray today - Dose diclofenac twice a day on as-needed basis for hip/knee pain - Start home exercises for the hip this weekend and continue on a regular basis - Contact us at the 2-week mark or beyond for any lingering knee pain or issues otherwise

## 2022-07-02 ENCOUNTER — Ambulatory Visit: Payer: Self-pay | Admitting: Family Medicine

## 2022-09-11 IMAGING — CR DG HIP (WITH OR WITHOUT PELVIS) 2-3V*R*
3 series · 3 of 3 positions shown · non-contrast
Comparison: None.

CLINICAL DATA: Chronic right hip pain

EXAM:
DG HIP (WITH OR WITHOUT PELVIS) 2-3V RIGHT

[pelvis ap]
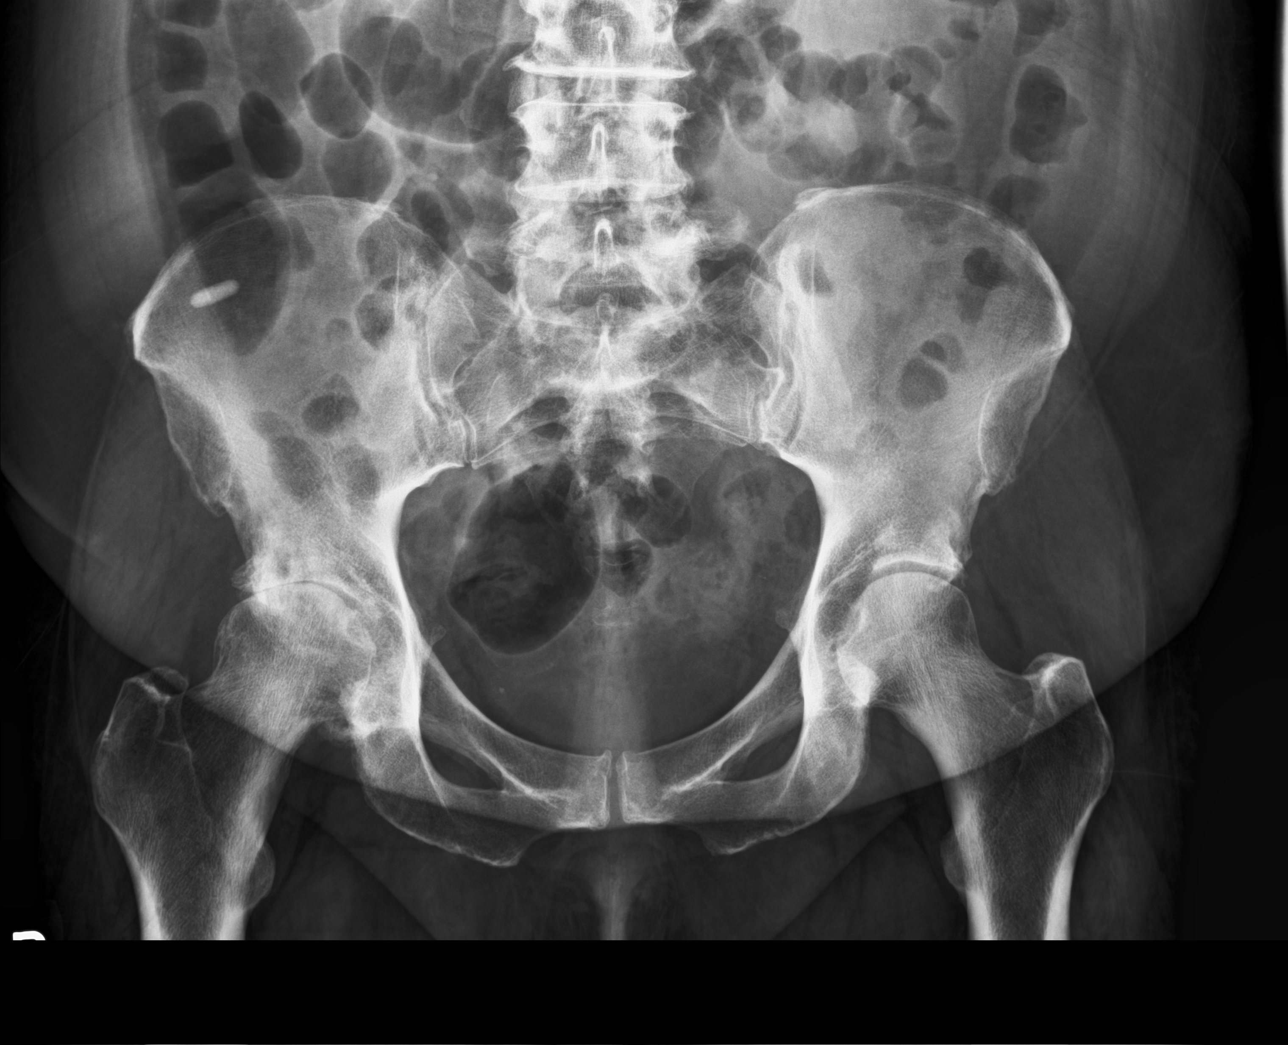

[hip ap]
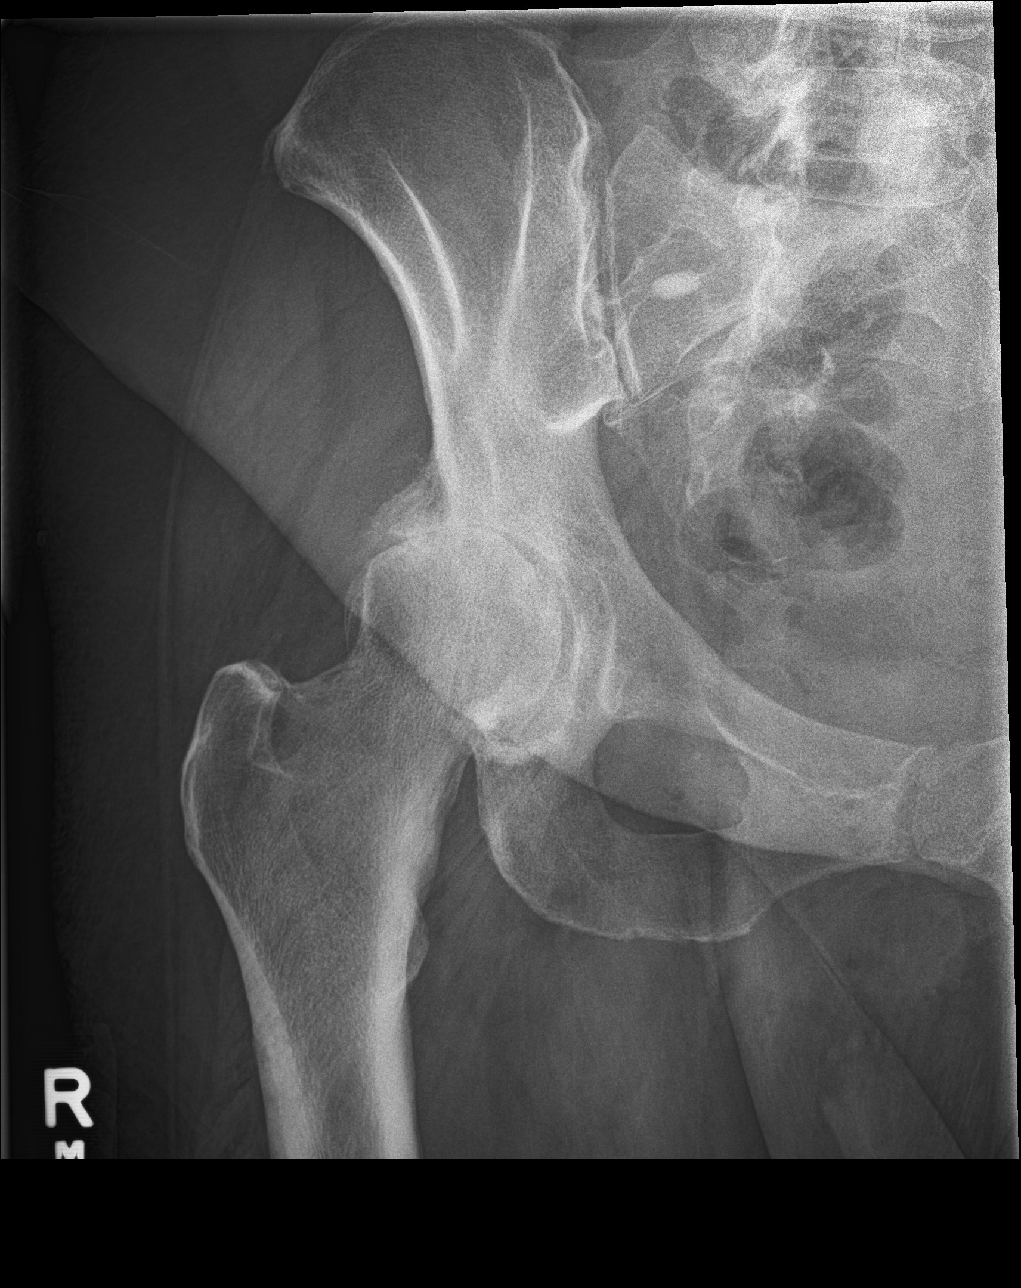

[hip lat]
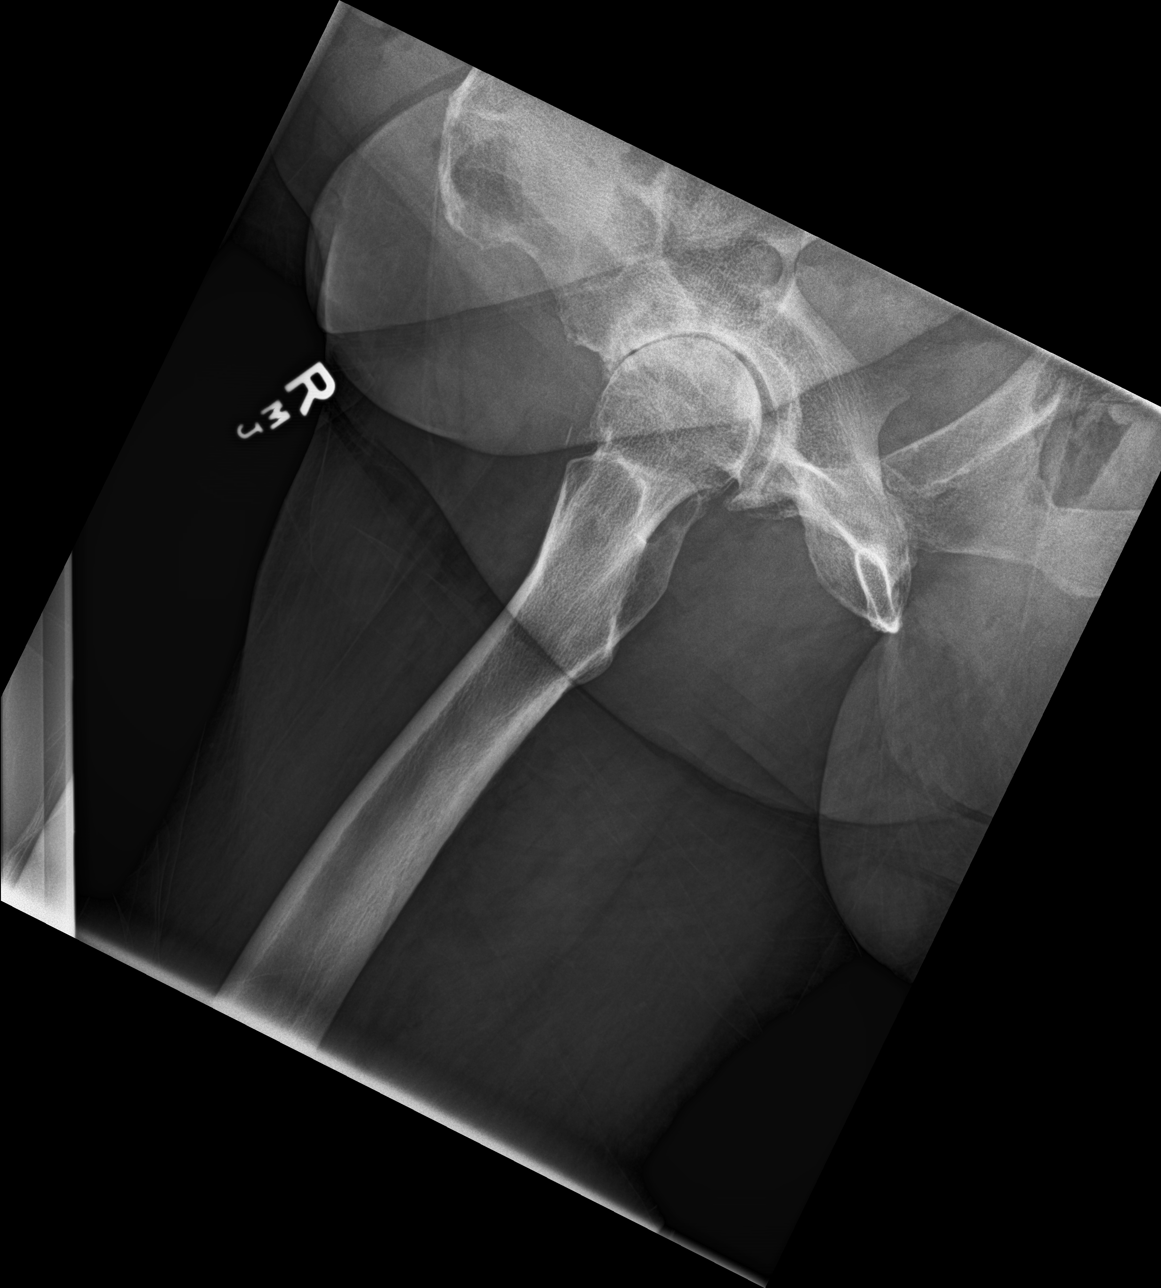

[3 of 3 positions shown; findings below may reference images not displayed]

FINDINGS: No recent fracture or dislocation is seen. Severe degenerative
changes are noted in the right hip with marked joint space
narrowing, bony spurs and subcortical cysts. Degenerative changes
are noted in the visualized lower lumbar spine.
IMPRESSION: No recent fracture or dislocation is seen. Severe degenerative
changes are noted in the right hip.

## 2023-02-22 DIAGNOSIS — M25551 Pain in right hip: Secondary | ICD-10-CM | POA: Diagnosis not present

## 2023-02-22 DIAGNOSIS — M47816 Spondylosis without myelopathy or radiculopathy, lumbar region: Secondary | ICD-10-CM | POA: Diagnosis not present

## 2023-02-22 DIAGNOSIS — M1611 Unilateral primary osteoarthritis, right hip: Secondary | ICD-10-CM | POA: Diagnosis not present

## 2023-02-24 DIAGNOSIS — Z720 Tobacco use: Secondary | ICD-10-CM | POA: Diagnosis not present

## 2023-02-24 DIAGNOSIS — I5020 Unspecified systolic (congestive) heart failure: Secondary | ICD-10-CM | POA: Diagnosis not present

## 2023-02-24 DIAGNOSIS — E785 Hyperlipidemia, unspecified: Secondary | ICD-10-CM | POA: Diagnosis not present

## 2023-02-24 DIAGNOSIS — M1611 Unilateral primary osteoarthritis, right hip: Secondary | ICD-10-CM | POA: Diagnosis not present

## 2023-04-19 DIAGNOSIS — H04123 Dry eye syndrome of bilateral lacrimal glands: Secondary | ICD-10-CM | POA: Diagnosis not present

## 2023-04-19 DIAGNOSIS — E871 Hypo-osmolality and hyponatremia: Secondary | ICD-10-CM | POA: Diagnosis not present

## 2023-04-19 DIAGNOSIS — H43391 Other vitreous opacities, right eye: Secondary | ICD-10-CM | POA: Diagnosis not present

## 2023-04-19 DIAGNOSIS — H5015 Alternating exotropia: Secondary | ICD-10-CM | POA: Diagnosis not present

## 2023-04-19 DIAGNOSIS — I5020 Unspecified systolic (congestive) heart failure: Secondary | ICD-10-CM | POA: Diagnosis not present

## 2023-05-31 DIAGNOSIS — F17200 Nicotine dependence, unspecified, uncomplicated: Secondary | ICD-10-CM | POA: Diagnosis not present

## 2023-05-31 DIAGNOSIS — M1611 Unilateral primary osteoarthritis, right hip: Secondary | ICD-10-CM | POA: Diagnosis not present

## 2023-07-10 DIAGNOSIS — M1990 Unspecified osteoarthritis, unspecified site: Secondary | ICD-10-CM | POA: Diagnosis not present

## 2023-07-10 DIAGNOSIS — Z8249 Family history of ischemic heart disease and other diseases of the circulatory system: Secondary | ICD-10-CM | POA: Diagnosis not present

## 2023-07-10 DIAGNOSIS — L309 Dermatitis, unspecified: Secondary | ICD-10-CM | POA: Diagnosis not present

## 2023-07-10 DIAGNOSIS — I130 Hypertensive heart and chronic kidney disease with heart failure and stage 1 through stage 4 chronic kidney disease, or unspecified chronic kidney disease: Secondary | ICD-10-CM | POA: Diagnosis not present

## 2023-07-10 DIAGNOSIS — N189 Chronic kidney disease, unspecified: Secondary | ICD-10-CM | POA: Diagnosis not present

## 2023-07-10 DIAGNOSIS — Z9181 History of falling: Secondary | ICD-10-CM | POA: Diagnosis not present

## 2023-07-10 DIAGNOSIS — E785 Hyperlipidemia, unspecified: Secondary | ICD-10-CM | POA: Diagnosis not present

## 2023-07-10 DIAGNOSIS — F17210 Nicotine dependence, cigarettes, uncomplicated: Secondary | ICD-10-CM | POA: Diagnosis not present

## 2023-07-10 DIAGNOSIS — I429 Cardiomyopathy, unspecified: Secondary | ICD-10-CM | POA: Diagnosis not present

## 2023-07-10 DIAGNOSIS — I509 Heart failure, unspecified: Secondary | ICD-10-CM | POA: Diagnosis not present

## 2023-07-10 DIAGNOSIS — R32 Unspecified urinary incontinence: Secondary | ICD-10-CM | POA: Diagnosis not present

## 2023-07-10 DIAGNOSIS — Z833 Family history of diabetes mellitus: Secondary | ICD-10-CM | POA: Diagnosis not present

## 2023-08-05 DIAGNOSIS — H3582 Retinal ischemia: Secondary | ICD-10-CM | POA: Diagnosis not present

## 2023-08-05 DIAGNOSIS — H40051 Ocular hypertension, right eye: Secondary | ICD-10-CM | POA: Diagnosis not present

## 2023-08-05 DIAGNOSIS — H211X1 Other vascular disorders of iris and ciliary body, right eye: Secondary | ICD-10-CM | POA: Diagnosis not present

## 2023-08-05 DIAGNOSIS — H43391 Other vitreous opacities, right eye: Secondary | ICD-10-CM | POA: Diagnosis not present

## 2023-08-05 DIAGNOSIS — H5461 Unqualified visual loss, right eye, normal vision left eye: Secondary | ICD-10-CM | POA: Diagnosis not present

## 2023-09-11 DIAGNOSIS — I6521 Occlusion and stenosis of right carotid artery: Secondary | ICD-10-CM | POA: Diagnosis not present

## 2023-09-11 DIAGNOSIS — E042 Nontoxic multinodular goiter: Secondary | ICD-10-CM | POA: Diagnosis not present

## 2023-09-11 DIAGNOSIS — Z7982 Long term (current) use of aspirin: Secondary | ICD-10-CM | POA: Diagnosis not present

## 2023-09-11 DIAGNOSIS — M4802 Spinal stenosis, cervical region: Secondary | ICD-10-CM | POA: Diagnosis not present

## 2023-09-11 DIAGNOSIS — Z5986 Financial insecurity: Secondary | ICD-10-CM | POA: Diagnosis not present

## 2023-09-11 DIAGNOSIS — I428 Other cardiomyopathies: Secondary | ICD-10-CM | POA: Diagnosis not present

## 2023-09-11 DIAGNOSIS — M47812 Spondylosis without myelopathy or radiculopathy, cervical region: Secondary | ICD-10-CM | POA: Diagnosis not present

## 2023-09-11 DIAGNOSIS — F17210 Nicotine dependence, cigarettes, uncomplicated: Secondary | ICD-10-CM | POA: Diagnosis not present

## 2023-09-11 DIAGNOSIS — I6522 Occlusion and stenosis of left carotid artery: Secondary | ICD-10-CM | POA: Diagnosis not present

## 2023-09-11 DIAGNOSIS — H47091 Other disorders of optic nerve, not elsewhere classified, right eye: Secondary | ICD-10-CM | POA: Diagnosis not present

## 2023-09-11 DIAGNOSIS — H5461 Unqualified visual loss, right eye, normal vision left eye: Secondary | ICD-10-CM | POA: Diagnosis not present

## 2023-09-11 DIAGNOSIS — E119 Type 2 diabetes mellitus without complications: Secondary | ICD-10-CM | POA: Diagnosis not present

## 2023-09-11 DIAGNOSIS — Z59811 Housing instability, housed, with risk of homelessness: Secondary | ICD-10-CM | POA: Diagnosis not present

## 2023-09-11 DIAGNOSIS — M9931 Osseous stenosis of neural canal of cervical region: Secondary | ICD-10-CM | POA: Diagnosis not present

## 2023-09-11 DIAGNOSIS — I110 Hypertensive heart disease with heart failure: Secondary | ICD-10-CM | POA: Diagnosis not present

## 2023-09-11 DIAGNOSIS — H5010 Unspecified exotropia: Secondary | ICD-10-CM | POA: Diagnosis not present

## 2023-09-11 DIAGNOSIS — H3582 Retinal ischemia: Secondary | ICD-10-CM | POA: Diagnosis not present

## 2023-09-11 DIAGNOSIS — I5022 Chronic systolic (congestive) heart failure: Secondary | ICD-10-CM | POA: Diagnosis not present

## 2023-09-11 DIAGNOSIS — H469 Unspecified optic neuritis: Secondary | ICD-10-CM | POA: Diagnosis not present

## 2023-09-11 DIAGNOSIS — Z5941 Food insecurity: Secondary | ICD-10-CM | POA: Diagnosis not present

## 2023-09-11 DIAGNOSIS — H4720 Unspecified optic atrophy: Secondary | ICD-10-CM | POA: Diagnosis not present

## 2023-09-11 DIAGNOSIS — Z79899 Other long term (current) drug therapy: Secondary | ICD-10-CM | POA: Diagnosis not present

## 2023-09-12 DIAGNOSIS — H47091 Other disorders of optic nerve, not elsewhere classified, right eye: Secondary | ICD-10-CM | POA: Diagnosis not present

## 2023-09-12 DIAGNOSIS — H4720 Unspecified optic atrophy: Secondary | ICD-10-CM | POA: Diagnosis not present

## 2023-09-13 DIAGNOSIS — H3582 Retinal ischemia: Secondary | ICD-10-CM | POA: Diagnosis not present

## 2023-09-13 DIAGNOSIS — M47812 Spondylosis without myelopathy or radiculopathy, cervical region: Secondary | ICD-10-CM | POA: Diagnosis not present

## 2023-09-13 DIAGNOSIS — M4802 Spinal stenosis, cervical region: Secondary | ICD-10-CM | POA: Diagnosis not present

## 2023-09-13 DIAGNOSIS — M9931 Osseous stenosis of neural canal of cervical region: Secondary | ICD-10-CM | POA: Diagnosis not present

## 2023-09-13 DIAGNOSIS — E119 Type 2 diabetes mellitus without complications: Secondary | ICD-10-CM | POA: Diagnosis not present

## 2023-09-13 DIAGNOSIS — I6522 Occlusion and stenosis of left carotid artery: Secondary | ICD-10-CM | POA: Diagnosis not present

## 2023-09-13 DIAGNOSIS — H5461 Unqualified visual loss, right eye, normal vision left eye: Secondary | ICD-10-CM | POA: Diagnosis not present

## 2023-09-14 DIAGNOSIS — H3582 Retinal ischemia: Secondary | ICD-10-CM | POA: Diagnosis not present

## 2023-09-14 DIAGNOSIS — H5461 Unqualified visual loss, right eye, normal vision left eye: Secondary | ICD-10-CM | POA: Diagnosis not present

## 2023-09-15 DIAGNOSIS — H5461 Unqualified visual loss, right eye, normal vision left eye: Secondary | ICD-10-CM | POA: Diagnosis not present

## 2023-09-15 DIAGNOSIS — H3582 Retinal ischemia: Secondary | ICD-10-CM | POA: Diagnosis not present

## 2023-09-15 DIAGNOSIS — I6522 Occlusion and stenosis of left carotid artery: Secondary | ICD-10-CM | POA: Diagnosis not present

## 2023-09-15 DIAGNOSIS — E119 Type 2 diabetes mellitus without complications: Secondary | ICD-10-CM | POA: Diagnosis not present

## 2023-09-28 DIAGNOSIS — I771 Stricture of artery: Secondary | ICD-10-CM | POA: Diagnosis not present

## 2023-09-28 DIAGNOSIS — I6521 Occlusion and stenosis of right carotid artery: Secondary | ICD-10-CM | POA: Diagnosis not present

## 2023-10-01 DIAGNOSIS — R519 Headache, unspecified: Secondary | ICD-10-CM | POA: Diagnosis not present

## 2023-10-01 DIAGNOSIS — H3582 Retinal ischemia: Secondary | ICD-10-CM | POA: Diagnosis not present

## 2023-10-01 DIAGNOSIS — F17210 Nicotine dependence, cigarettes, uncomplicated: Secondary | ICD-10-CM | POA: Diagnosis not present

## 2023-10-01 DIAGNOSIS — H5461 Unqualified visual loss, right eye, normal vision left eye: Secondary | ICD-10-CM | POA: Diagnosis not present

## 2023-10-07 DIAGNOSIS — H3582 Retinal ischemia: Secondary | ICD-10-CM | POA: Diagnosis not present

## 2023-10-07 DIAGNOSIS — H5461 Unqualified visual loss, right eye, normal vision left eye: Secondary | ICD-10-CM | POA: Diagnosis not present

## 2023-10-07 DIAGNOSIS — H211X1 Other vascular disorders of iris and ciliary body, right eye: Secondary | ICD-10-CM | POA: Diagnosis not present

## 2023-10-07 DIAGNOSIS — H4051X0 Glaucoma secondary to other eye disorders, right eye, stage unspecified: Secondary | ICD-10-CM | POA: Diagnosis not present

## 2023-10-13 DIAGNOSIS — E785 Hyperlipidemia, unspecified: Secondary | ICD-10-CM | POA: Diagnosis not present

## 2023-10-13 DIAGNOSIS — I5020 Unspecified systolic (congestive) heart failure: Secondary | ICD-10-CM | POA: Diagnosis not present

## 2023-10-13 DIAGNOSIS — M1611 Unilateral primary osteoarthritis, right hip: Secondary | ICD-10-CM | POA: Diagnosis not present

## 2023-10-13 DIAGNOSIS — Z720 Tobacco use: Secondary | ICD-10-CM | POA: Diagnosis not present

## 2023-10-21 DIAGNOSIS — I771 Stricture of artery: Secondary | ICD-10-CM | POA: Diagnosis not present

## 2023-10-21 DIAGNOSIS — I708 Atherosclerosis of other arteries: Secondary | ICD-10-CM | POA: Diagnosis not present

## 2023-11-02 DIAGNOSIS — I6521 Occlusion and stenosis of right carotid artery: Secondary | ICD-10-CM | POA: Diagnosis not present

## 2024-01-17 DIAGNOSIS — H211X1 Other vascular disorders of iris and ciliary body, right eye: Secondary | ICD-10-CM | POA: Diagnosis not present

## 2024-01-17 DIAGNOSIS — H3582 Retinal ischemia: Secondary | ICD-10-CM | POA: Diagnosis not present

## 2024-02-01 DIAGNOSIS — Z1330 Encounter for screening examination for mental health and behavioral disorders, unspecified: Secondary | ICD-10-CM | POA: Diagnosis not present

## 2024-02-01 DIAGNOSIS — F17200 Nicotine dependence, unspecified, uncomplicated: Secondary | ICD-10-CM | POA: Diagnosis not present

## 2024-02-01 DIAGNOSIS — E119 Type 2 diabetes mellitus without complications: Secondary | ICD-10-CM | POA: Diagnosis not present

## 2024-02-01 DIAGNOSIS — M25551 Pain in right hip: Secondary | ICD-10-CM | POA: Diagnosis not present

## 2024-02-01 DIAGNOSIS — R053 Chronic cough: Secondary | ICD-10-CM | POA: Diagnosis not present

## 2024-02-01 DIAGNOSIS — M1611 Unilateral primary osteoarthritis, right hip: Secondary | ICD-10-CM | POA: Diagnosis not present

## 2024-02-01 DIAGNOSIS — I428 Other cardiomyopathies: Secondary | ICD-10-CM | POA: Diagnosis not present

## 2024-02-01 DIAGNOSIS — G8929 Other chronic pain: Secondary | ICD-10-CM | POA: Diagnosis not present

## 2024-02-01 DIAGNOSIS — Z1331 Encounter for screening for depression: Secondary | ICD-10-CM | POA: Diagnosis not present
